# Patient Record
Sex: Female | Born: 1960 | Race: White | Hispanic: No | Marital: Married | Smoking: Never smoker
Health system: Southern US, Community
[De-identification: ages and names within clinical notes are randomized; demographics above are authoritative.]

## PROBLEM LIST (undated history)

## (undated) DIAGNOSIS — C6992 Malignant neoplasm of unspecified site of left eye: Secondary | ICD-10-CM

## (undated) HISTORY — PX: EYE SURGERY: SHX253

## (undated) HISTORY — DX: Malignant neoplasm of unspecified site of left eye: C69.92

---

## 2020-10-24 ENCOUNTER — Ambulatory Visit (INDEPENDENT_AMBULATORY_CARE_PROVIDER_SITE_OTHER): Payer: Medicare Other | Admitting: Registered Nurse

## 2020-10-24 ENCOUNTER — Other Ambulatory Visit: Payer: Self-pay

## 2020-10-24 ENCOUNTER — Encounter: Payer: Self-pay | Admitting: Registered Nurse

## 2020-10-24 VITALS — BP 121/74 | HR 78 | Temp 98.2°F | Resp 18 | Ht 66.0 in | Wt 128.2 lb

## 2020-10-24 DIAGNOSIS — I10 Essential (primary) hypertension: Secondary | ICD-10-CM

## 2020-10-24 DIAGNOSIS — F4321 Adjustment disorder with depressed mood: Secondary | ICD-10-CM | POA: Diagnosis not present

## 2020-10-24 DIAGNOSIS — G479 Sleep disorder, unspecified: Secondary | ICD-10-CM | POA: Diagnosis not present

## 2020-10-24 DIAGNOSIS — Z1211 Encounter for screening for malignant neoplasm of colon: Secondary | ICD-10-CM

## 2020-10-24 DIAGNOSIS — Z130 Encounter for screening for diseases of the blood and blood-forming organs and certain disorders involving the immune mechanism: Secondary | ICD-10-CM

## 2020-10-24 DIAGNOSIS — Z13228 Encounter for screening for other metabolic disorders: Secondary | ICD-10-CM | POA: Diagnosis not present

## 2020-10-24 DIAGNOSIS — G43709 Chronic migraine without aura, not intractable, without status migrainosus: Secondary | ICD-10-CM | POA: Diagnosis not present

## 2020-10-24 DIAGNOSIS — R7989 Other specified abnormal findings of blood chemistry: Secondary | ICD-10-CM

## 2020-10-24 DIAGNOSIS — Z8639 Personal history of other endocrine, nutritional and metabolic disease: Secondary | ICD-10-CM

## 2020-10-24 DIAGNOSIS — J029 Acute pharyngitis, unspecified: Secondary | ICD-10-CM

## 2020-10-24 DIAGNOSIS — Z13220 Encounter for screening for lipoid disorders: Secondary | ICD-10-CM

## 2020-10-24 DIAGNOSIS — Z1329 Encounter for screening for other suspected endocrine disorder: Secondary | ICD-10-CM

## 2020-10-24 MED ORDER — TEMAZEPAM 30 MG PO CAPS: 30.0000 mg | ORAL_CAPSULE | Freq: Every evening | ORAL | 0 refills | Status: DC | PRN

## 2020-10-24 MED ORDER — BUTALBITAL-APAP-CAFFEINE 50-300-40 MG PO CAPS
1.0000 | ORAL_CAPSULE | Freq: Four times a day (QID) | ORAL | 0 refills | Status: DC | PRN
Start: 2020-10-24 — End: 2021-03-15

## 2020-10-24 MED ORDER — FLUCONAZOLE 150 MG PO TABS: 150.0000 mg | ORAL_TABLET | Freq: Once | ORAL | 0 refills | Status: AC

## 2020-10-24 MED ORDER — INDAPAMIDE 2.5 MG PO TABS: 2.5000 mg | ORAL_TABLET | Freq: Every day | ORAL | 1 refills | Status: DC

## 2020-10-24 MED ORDER — AMOXICILLIN-POT CLAVULANATE 875-125 MG PO TABS: 1.0000 | ORAL_TABLET | Freq: Two times a day (BID) | ORAL | 0 refills | Status: DC
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
## 2020-10-24 NOTE — Patient Instructions (Signed)
° ° ° °  If you have lab work done today you will be contacted with your lab results within the next 2 weeks.  If you have not heard from us then please contact us. The fastest way to get your results is to register for My Chart. ° ° °IF you received an x-ray today, you will receive an invoice from [facility]. Please contact [facility] at [phone] with questions or concerns regarding your invoice.  ° °IF you received labwork today, you will receive an invoice from [facility]. Please contact [facility] at [phone] with questions or concerns regarding your invoice.  ° °Our billing staff will not be able to assist you with questions regarding bills from these companies. ° °You will be contacted with the lab results as soon as they are available. The fastest way to get your results is to activate your My Chart account. Instructions are located on the last page of this paperwork. If you have not heard from us regarding the results in 2 weeks, please contact this office. °  ° ° ° °

## 2020-10-25 ENCOUNTER — Encounter: Payer: Self-pay | Admitting: Neurology

## 2020-10-25 ENCOUNTER — Other Ambulatory Visit: Payer: Self-pay

## 2020-10-25 LAB — CBC WITH DIFFERENTIAL/PLATELET
Basophils Absolute: 0 10*3/uL (ref 0.0–0.1)
Basophils Relative: 0.3 % (ref 0.0–3.0)
Eosinophils Absolute: 0.1 10*3/uL (ref 0.0–0.7)
Eosinophils Relative: 1.4 % (ref 0.0–5.0)
HCT: 38.9 % (ref 36.0–46.0)
Hemoglobin: 12.9 g/dL (ref 12.0–15.0)
Lymphocytes Relative: 32 % (ref 12.0–46.0)
Lymphs Abs: 1.7 10*3/uL (ref 0.7–4.0)
MCHC: 33.2 g/dL (ref 30.0–36.0)
MCV: 88.8 fl (ref 78.0–100.0)
Monocytes Absolute: 0.4 10*3/uL (ref 0.1–1.0)
Monocytes Relative: 8.2 % (ref 3.0–12.0)
Neutro Abs: 3.1 10*3/uL (ref 1.4–7.7)
Neutrophils Relative %: 58.1 % (ref 43.0–77.0)
Platelets: 176 10*3/uL (ref 150.0–400.0)
RBC: 4.38 Mil/uL (ref 3.87–5.11)
RDW: 13.4 % (ref 11.5–15.5)
WBC: 5.4 10*3/uL (ref 4.0–10.5)

## 2020-10-25 LAB — LIPID PANEL
Cholesterol: 188 mg/dL (ref 0–200)
HDL: 57.4 mg/dL (ref >39.00)
LDL Cholesterol: 99 mg/dL (ref 0–99)
NonHDL: 130.78
Total CHOL/HDL Ratio: 3
Triglycerides: 157 mg/dL — ABNORMAL HIGH (ref 0.0–149.0)
VLDL: 31.4 mg/dL (ref 0.0–40.0)

## 2020-10-25 LAB — COMPREHENSIVE METABOLIC PANEL
ALT: 17 U/L (ref 0–35)
AST: 29 U/L (ref 0–37)
Albumin: 4.2 g/dL (ref 3.5–5.2)
Alkaline Phosphatase: 78 U/L (ref 39–117)
BUN: 12 mg/dL (ref 6–23)
CO2: 28 mEq/L (ref 19–32)
Calcium: 9.2 mg/dL (ref 8.4–10.5)
Chloride: 98 mEq/L (ref 96–112)
Creatinine, Ser: 0.73 mg/dL (ref 0.40–1.20)
GFR: 89.28 mL/min (ref >60.00)
Glucose, Bld: 79 mg/dL (ref 70–99)
Potassium: 3.4 mEq/L — ABNORMAL LOW (ref 3.5–5.1)
Sodium: 134 mEq/L — ABNORMAL LOW (ref 135–145)
Total Bilirubin: 0.4 mg/dL (ref 0.2–1.2)
Total Protein: 6.6 g/dL (ref 6.0–8.3)

## 2020-10-25 LAB — HEMOGLOBIN A1C: Hgb A1c MFr Bld: 5.4 % (ref 4.6–6.5)

## 2020-10-25 LAB — TSH: TSH: 1.76 u[IU]/mL (ref 0.35–5.50)

## 2020-10-26 ENCOUNTER — Encounter: Payer: Self-pay | Admitting: Registered Nurse

## 2020-11-01 ENCOUNTER — Telehealth (INDEPENDENT_AMBULATORY_CARE_PROVIDER_SITE_OTHER): Payer: Medicare Other | Admitting: Registered Nurse

## 2020-11-01 ENCOUNTER — Other Ambulatory Visit: Payer: Self-pay

## 2020-11-01 DIAGNOSIS — J302 Other seasonal allergic rhinitis: Secondary | ICD-10-CM

## 2020-11-01 MED ORDER — CETIRIZINE HCL 10 MG PO TABS: 10.0000 mg | ORAL_TABLET | Freq: Every day | ORAL | 11 refills | Status: AC

## 2020-11-01 MED ORDER — AZELASTINE HCL 0.1 % NA SOLN
1.0000 | Freq: Two times a day (BID) | NASAL | 12 refills | Status: DC
Start: 2020-11-01 — End: 2021-04-17

## 2020-11-01 MED ORDER — PREDNISONE 10 MG (21) PO TBPK
ORAL_TABLET | ORAL | 0 refills | Status: DC
Start: 2020-11-01 — End: 2021-04-17

## 2020-11-01 NOTE — Progress Notes (Signed)
Telemedicine Encounter- SOAP NOTE Established Patient  This video encounter was conducted with the patient's (or proxy's) verbal consent via audio telecommunications: yes/no: Yes Patient was instructed to have this encounter in a suitably private space; and to only have persons present to whom they give permission to participate. In addition, patient identity was confirmed by use of name plus two identifiers (DOB and address).  I discussed the limitations, risks, security and privacy concerns of performing an evaluation and management service by telephone and the availability of in person appointments. I also discussed with the patient that there may be a patient responsible charge related to this service. The patient expressed understanding and agreed to proceed.  I spent a total of 16 minutes talking with the patient or their proxy.  Patient at home Provider in office  Participants: Kathrin Ruddy, NP and Margrett Rud  No chief complaint on file.   Subjective   Sally Ware is a 60 y.o. established patient. Video visit today for sore throat  HPI Ongoing. Has been on abx for a week Persistent headaches   Hx of seasonal allergies - noted first when she had moved to [place]. Originally from [place].  A lot of mucus production  Has been using mucinex.  Has not been using daily antihistamines    There are no problems to display for this patient.   Past Medical History:  Diagnosis Date   Eye cancer, left [facility])     Current Outpatient Medications  Medication Sig Dispense Refill   amoxicillin-clavulanate (AUGMENTIN) 875-125 MG tablet Take 1 tablet by mouth 2 (two) times daily. 20 tablet 0   buPROPion (WELLBUTRIN XL) 150 MG 24 hr tablet Take 150 mg by mouth daily.     Butalbital-APAP-Caffeine 50-300-40 MG CAPS Take 1 tablet by mouth every 6 (six) hours as needed. 84 capsule 0   indapamide (LOZOL) 2.5 MG tablet Take 1 tablet (2.5 mg total) by mouth daily. 90 tablet 1   indapamide  (LOZOL) 2.5 MG tablet Take by mouth.     LORazepam (ATIVAN) 1 MG tablet Take by mouth.     meclizine (ANTIVERT) 25 MG tablet Take 25 mg by mouth 3 (three) times daily as needed for dizziness.     temazepam (RESTORIL) 30 MG capsule Take 1 capsule (30 mg total) by mouth at bedtime as needed for sleep. 30 capsule 0   topiramate (TOPAMAX) 50 MG tablet Take by mouth.     No current facility-administered medications for this visit.    No Known Allergies  Social History   Socioeconomic History   Marital status: Married    Spouse name: Not on file   Number of children: Not on file   Years of education: Not on file   Highest education level: Not on file  Occupational History   Not on file  Tobacco Use   Smoking status: Never   Smokeless tobacco: Never  Vaping Use   Vaping Use: Never used  Substance and Sexual Activity   Alcohol use: Not Currently   Drug use: Not Currently   Sexual activity: Not Currently  Other Topics Concern   Not on file  Social History Narrative   Not on file   Social Determinants of Health   Financial Resource Strain: Not on file  Food Insecurity: Not on file  Transportation Needs: Not on file  Physical Activity: Not on file  Stress: Not on file  Social Connections: Not on file  Intimate Partner Violence: Not on file  ROS Per hpi   Objective   Vitals as reported by the patient: There were no vitals filed for this visit.  There are no diagnoses linked to this encounter.  PLAN Suspect alleriges/inflammation. Treat as above. Return if worsening or failing to improve Can consider consult with ENT or allergy doc if persistent Patient encouraged to call clinic with any questions, comments, or concerns.  I discussed the assessment and treatment plan with the patient. The patient was provided an opportunity to ask questions and all were answered. The patient agreed with the plan and demonstrated an understanding of the instructions.   The patient  was advised to call back or seek an in-person evaluation if the symptoms worsen or if the condition fails to improve as anticipated.  I provided 14 minutes of face-to-face time during this encounter.  Maximiano Coss, NP

## 2020-11-03 LAB — COLOGUARD: Cologuard: NEGATIVE

## 2020-11-19 ENCOUNTER — Other Ambulatory Visit: Payer: Self-pay | Admitting: Registered Nurse

## 2020-11-19 DIAGNOSIS — Z1231 Encounter for screening mammogram for malignant neoplasm of breast: Secondary | ICD-10-CM

## 2020-12-03 ENCOUNTER — Encounter: Payer: Self-pay | Admitting: Registered Nurse

## 2020-12-04 ENCOUNTER — Other Ambulatory Visit: Payer: Self-pay

## 2020-12-04 DIAGNOSIS — G43709 Chronic migraine without aura, not intractable, without status migrainosus: Secondary | ICD-10-CM

## 2020-12-04 MED ORDER — TOPIRAMATE 50 MG PO TABS: 50.0000 mg | ORAL_TABLET | Freq: Every day | ORAL | 3 refills | Status: DC

## 2020-12-05 ENCOUNTER — Encounter: Payer: Self-pay | Admitting: Registered Nurse

## 2020-12-06 ENCOUNTER — Other Ambulatory Visit: Payer: Self-pay | Admitting: Registered Nurse

## 2020-12-06 DIAGNOSIS — G43709 Chronic migraine without aura, not intractable, without status migrainosus: Secondary | ICD-10-CM

## 2020-12-06 DIAGNOSIS — Z1231 Encounter for screening mammogram for malignant neoplasm of breast: Secondary | ICD-10-CM

## 2020-12-06 MED ORDER — TOPIRAMATE 50 MG PO TABS: 50.0000 mg | ORAL_TABLET | Freq: Two times a day (BID) | ORAL | 3 refills | Status: DC

## 2020-12-16 ENCOUNTER — Other Ambulatory Visit: Payer: Self-pay | Admitting: Registered Nurse

## 2020-12-16 ENCOUNTER — Encounter: Payer: Self-pay | Admitting: Registered Nurse

## 2020-12-18 ENCOUNTER — Encounter: Payer: Self-pay | Admitting: Registered Nurse

## 2020-12-18 ENCOUNTER — Other Ambulatory Visit: Payer: Self-pay

## 2020-12-18 MED ORDER — LORAZEPAM 1 MG PO TABS
0.5000 mg | ORAL_TABLET | Freq: Two times a day (BID) | ORAL | 0 refills | Status: DC | PRN
  Filled 2020-12-18: qty 90, 45d supply, fill #0

## 2020-12-20 ENCOUNTER — Other Ambulatory Visit: Payer: Self-pay | Admitting: Registered Nurse

## 2020-12-20 DIAGNOSIS — F4321 Adjustment disorder with depressed mood: Secondary | ICD-10-CM

## 2020-12-20 DIAGNOSIS — G479 Sleep disorder, unspecified: Secondary | ICD-10-CM

## 2020-12-20 MED ORDER — LORAZEPAM 1 MG PO TABS
1.0000 mg | ORAL_TABLET | Freq: Two times a day (BID) | ORAL | 1 refills | Status: DC | PRN
Start: 2020-12-20 — End: 2021-04-17

## 2020-12-20 NOTE — Telephone Encounter (Signed)
Resent  Thanks  [employer]

## 2021-01-17 ENCOUNTER — Ambulatory Visit
Admission: RE | Admit: 2021-01-17 | Discharge: 2021-01-17 | Disposition: A | Discharge status: Home / Self Care | Payer: Medicare Other | Source: Ambulatory Visit | Attending: Registered Nurse | Admitting: Registered Nurse

## 2021-01-17 DIAGNOSIS — Z1231 Encounter for screening mammogram for malignant neoplasm of breast: Secondary | ICD-10-CM

## 2021-01-24 ENCOUNTER — Encounter: Payer: Self-pay | Admitting: Registered Nurse

## 2021-01-30 NOTE — Progress Notes (Signed)
NEUROLOGY CONSULTATION NOTE  Sally Ware MRN: [id number] DOB: 01-20-1961  Referring provider: Maximiano Coss, NP Primary care provider:  Maximiano Coss, NP  Reason for consult:  migraines  Assessment/Plan:   Chronic migraine without aura, without status migrainosus, intractable - for years, she has had over 15 headache days a month.  She has tried and failed multiple migraine preventatives including topiramate, Depakote, venlafaxine, amitriptyline, and propranolol.  Migraine prevention:  I would like to start her on a CGRP inhibitor.  However, given that she is on Medicare, the cost may be high.  I have asked that she contact her insurance to find out which CGRP inhibitors are formulary and what would be the cost for her out of pocket.  If this is not a reasonable option, then I would recommend Botox Migraine rescue:  Will have her try samples of Ubrelvy 100mg  Limit use of pain relievers to no more than 2 days out of week to prevent risk of rebound or medication-overuse headache. Keep headache diary Follow up 6-7 months    Subjective:  Sally Ware is a 61 year old female who presents for migraines.  History supplemented by referring provider's note.  Onset:  61 years old Location:  top of head Quality:  pressure Intensity:  8-19/10.  She denies new headache, thunderclap headache Aura:  absent Prodrome:  absent Associated symptoms:  Nausea, photophobia, phonophobia.  She denies associated vomiting, visual aura, unilateral numbness or weakness. Duration:  Sometimes wakes up with headaches.  2 days Frequency:  4 to 5 days a week Frequency of abortive medication: Excedrin or Fioricet Triggers:  lunch meat, chocolate, alcohol Relieving factors:  headache hat, rest in dark and quiet room Activity:  aggravates.  Current NSAIDS/analgesics:  Excedrin, Fioricet Current triptans:  none Current ergotamine:  none Current anti-emetic:  none Current muscle relaxants:  none Current  Antihypertensive medications:  indapamide 2.5mg   Current Antidepressant medications:  bupropion ER 150mg  daily Current Anticonvulsant medications:  topiramate 50mg  BID Current anti-CGRP:  none Current Vitamins/Herbal/Supplements:  none Current Antihistamines/Decongestants:  Zyrtec Other therapy:  none Hormone/birth control:  none Other medications:  temazepam 30mg  QHS PRN (insomnia)  Past NSAIDS/analgesics:  Midrin Past abortive triptans:  sumatriptan tab/[place], rizatriptan, eletriptan, zolmitriptan, Axert Past abortive ergotamine:  none Past muscle relaxants:  none Past anti-emetic:  zofran, promethazine Past antihypertensive medications:  propranolol, verapamil Past antidepressant medications:  amitriptyline, venlafaxine Past anticonvulsant medications:  Depakote, gabapentin Past anti-CGRP:  none Past vitamins/Herbal/Supplements:  magnesium, riboflavin, CoQ10, feverfew, butterbur Past antihistamines/decongestants:  none Other past therapies:  accu-pressure, chiropractic  Caffeine:  No Alcohol:  no Smoker:  no Diet:  hydrates.  Eats healthy.  No soda.  Tries not to skip meals Exercise:  yes Depression/Anxiety:  Daughter was murdered by her husband several years ago.  She and her husband has since been raising her grandson who witnessed the event.  He has had mental health issues over the years which have been a great source of emotional stress.   Other pain:  no Sleep hygiene:  poor Family history of headache:  mother, daughters      PAST MEDICAL HISTORY: Past Medical History:  Diagnosis Date   Eye cancer, left ([place] [place])     PAST SURGICAL HISTORY: Past Surgical History:  Procedure Laterality Date   CESAREAN SECTION     EYE SURGERY      MEDICATIONS: Current Outpatient Medications on File Prior to Visit  Medication Sig Dispense Refill   azelastine (ASTELIN) 0.1 % nasal spray Place  1 spray into both nostrils 2 (two) times daily. Use in each nostril as directed 30 mL 12    buPROPion (WELLBUTRIN XL) 150 MG 24 hr tablet Take 150 mg by mouth daily.     Butalbital-APAP-Caffeine 50-300-40 MG CAPS Take 1 tablet by mouth every 6 (six) hours as needed. 84 capsule 0   cetirizine (ZYRTEC) 10 MG tablet Take 1 tablet (10 mg total) by mouth daily. 30 tablet 11   indapamide (LOZOL) 2.5 MG tablet Take 1 tablet (2.5 mg total) by mouth daily. 90 tablet 1   indapamide (LOZOL) 2.5 MG tablet Take by mouth.     LORazepam (ATIVAN) 1 MG tablet Take 1 tablet (1 mg total) by mouth 2 (two) times daily as needed for anxiety. 90 tablet 1   meclizine (ANTIVERT) 25 MG tablet Take 25 mg by mouth 3 (three) times daily as needed for dizziness.     predniSONE (STERAPRED UNI-PAK 21 TAB) 10 MG (21) TBPK tablet Take per package instructions. Do not skip doses. Finish entire supply. 1 each 0   temazepam (RESTORIL) 30 MG capsule Take 1 capsule (30 mg total) by mouth at bedtime as needed for sleep. 30 capsule 0   topiramate (TOPAMAX) 50 MG tablet Take 1 tablet (50 mg total) by mouth 2 (two) times daily. 180 tablet 3   No current facility-administered medications on file prior to visit.    ALLERGIES: No Known Allergies  FAMILY HISTORY: Family History  Problem Relation Age of Onset   Cancer Mother    Anxiety disorder Sister    OCD Sister    Healthy Daughter    Healthy Son     Objective:  Blood pressure 111/72, pulse 74, resp. rate 20, height 5\' 6"  (1.676 m), weight 132 lb (59.9 kg), SpO2 98 %. General: No acute distress.  Patient appears well-groomed.   Head:  Normocephalic/atraumatic Eyes:  fundi examined but not visualized Neck: supple, no paraspinal tenderness, full range of motion Back: No paraspinal tenderness Heart: regular rate and rhythm Lungs: Clear to auscultation bilaterally. Vascular: No carotid bruits. Neurological Exam: Mental status: alert and oriented to person, place, and time, recent and remote memory intact, fund of knowledge intact, attention and concentration intact,  speech fluent and not dysarthric, language intact. Cranial nerves: CN I: not tested CN II: pupils equal, round and reactive to light, visual fields intact CN III, IV, VI:  full range of motion, no nystagmus, no ptosis CN V: facial sensation intact. CN VII: upper and lower face symmetric CN VIII: hearing intact CN IX, X: gag intact, uvula midline CN XI: sternocleidomastoid and trapezius muscles intact CN XII: tongue midline Bulk & Tone: normal, no fasciculations. Motor:  muscle strength 5/5 throughout Sensation:  Pinprick, temperature and vibratory sensation intact. Deep Tendon Reflexes:  2+ throughout,  toes downgoing.   Finger to nose testing:  Without dysmetria.   Heel to shin:  Without dysmetria.   Gait:  Normal station and stride.  Romberg negative.    Thank you for allowing me to take part in the care of this patient.  Metta Clines, DO  CC: Maximiano Coss, NP

## 2021-01-31 ENCOUNTER — Other Ambulatory Visit: Payer: Self-pay

## 2021-01-31 ENCOUNTER — Encounter: Payer: Self-pay | Admitting: Neurology

## 2021-01-31 ENCOUNTER — Ambulatory Visit (INDEPENDENT_AMBULATORY_CARE_PROVIDER_SITE_OTHER): Payer: Medicare Other | Admitting: Neurology

## 2021-01-31 VITALS — BP 111/72 | HR 74 | Resp 20 | Ht 66.0 in | Wt 132.0 lb

## 2021-01-31 DIAGNOSIS — G43719 Chronic migraine without aura, intractable, without status migrainosus: Secondary | ICD-10-CM

## 2021-01-31 MED ORDER — ONDANSETRON HCL 4 MG PO TABS: 4.0000 mg | ORAL_TABLET | Freq: Three times a day (TID) | ORAL | 5 refills | Status: DC | PRN

## 2021-01-31 NOTE — Progress Notes (Signed)
Medication Samples have been provided to the patient.  Drug name: Roselyn Meier        Strength: 100 mg        Qty:  4 LOT: [id number]  Exp.Date: 7/23  Dosing instructions: as needed  The patient has been instructed regarding the correct time, dose, and frequency of taking this medication, including desired effects and most common side effects.   Venetia Night 3:26 PM 01/31/2021

## 2021-01-31 NOTE — Patient Instructions (Addendum)
°  Contact your insurance to find out which of following medications are formulary and what they would cost you - let me know:  Aimovig, Ajovy, Emgality, Ubrelvy, Nurtec Taper off of topiramate - take 50mg  at bedtime for one week, then stop Take Ubrelvy 100mg  at earliest onset of headache.  May repeat dose once in 2 hours if needed.  Maximum 2 tablets in 24 hours. Take ondansetron for nausea STOP EXCEDRIN AND FIORICET  Limit use of pain relievers to no more than 2 days out of the week.  These medications include acetaminophen, NSAIDs (ibuprofen/Advil/Motrin, naproxen/Aleve, triptans (Imitrex/sumatriptan), Excedrin, and narcotics.  This will help reduce risk of rebound headaches. Be aware of common food triggers:  - Caffeine:  coffee, black tea, cola, Mt. Dew  - Chocolate  - Dairy:  aged cheeses (brie, blue, cheddar, gouda, [place], provolone, [place], Swiss, etc), chocolate milk, buttermilk, sour cream, limit eggs and yogurt  - Nuts, peanut butter  - Alcohol  - Cereals/grains:  FRESH breads (fresh bagels, sourdough, doughnuts), yeast productions  - Processed/canned/aged/cured meats (pre-packaged deli meats, hotdogs)  - MSG/glutamate:  soy sauce, flavor enhancer, pickled/preserved/marinated foods  - Sweeteners:  aspartame (Equal, Nutrasweet).  Sugar and Splenda are okay  - Vegetables:  legumes (lima beans, lentils, snow peas, fava beans, pinto peans, peas, garbanzo beans), sauerkraut, onions, olives, pickles  - Fruit:  avocados, bananas, citrus fruit (orange, lemon, grapefruit), mango  - Other:  Frozen meals, macaroni and cheese Routine exercise Stay adequately hydrated (aim for 64 oz water daily) Keep headache diary Maintain proper stress management Maintain proper sleep hygiene Do not skip meals Consider supplements:  magnesium citrate 400mg  daily, riboflavin 400mg  daily, coenzyme Q10 100mg  three times daily.

## 2021-02-01 ENCOUNTER — Encounter: Payer: Self-pay | Admitting: Neurology

## 2021-02-04 ENCOUNTER — Other Ambulatory Visit: Payer: Self-pay | Admitting: Registered Nurse

## 2021-02-04 DIAGNOSIS — G479 Sleep disorder, unspecified: Secondary | ICD-10-CM

## 2021-02-05 MED ORDER — TEMAZEPAM 30 MG PO CAPS: 30.0000 mg | ORAL_CAPSULE | Freq: Every evening | ORAL | 0 refills | Status: DC | PRN

## 2021-02-05 NOTE — Telephone Encounter (Signed)
Patient is requesting a refill of the following medications: Requested Prescriptions   Pending Prescriptions Disp Refills   temazepam (RESTORIL) 30 MG capsule 30 capsule 0    Sig: Take 1 capsule (30 mg total) by mouth at bedtime as needed for sleep.    Date of patient request: 02/05/21 Last office visit: 01/30/21 Date of last refill: 10/24/20 Last refill amount: 30

## 2021-02-06 ENCOUNTER — Telehealth: Payer: Self-pay | Admitting: Neurology

## 2021-02-06 NOTE — Telephone Encounter (Signed)
Pt advised Botox will be checked and give her a call to schedule.

## 2021-02-06 NOTE — Telephone Encounter (Signed)
Pt called in stating the list of medications she was given will not be covered. They stated they would cover the botox if it was medically necessary and certified by our office.

## 2021-02-21 ENCOUNTER — Telehealth: Payer: Self-pay

## 2021-02-21 NOTE — Telephone Encounter (Signed)
New message   Fax [phone] & completed [employer] form for Botox medication   Attached clinical notes

## 2021-02-21 NOTE — Telephone Encounter (Signed)
How frequent or the headaches (on average, how many days a week/month are they occurring)?  Over a day, 20 Migraines since her last visit 01/31/21. Per Pt she wakes up with them  How long do the headaches last?  Over a day Verify what preventative medication and dose you are taking (e.g. topiramate, propranolol, amitriptyline, Emgality, etc)  NONE right now, Waiting on Botox approval.  Verify which rescue medication you are taking (triptan, Advil, Excedrin, Aleve, Ubrelvy, etc)  Urbelvy, not effective How often are you taking pain relievers/analgesics/rescue mediction?  Not at all.     Per pt since Monday 23rd she had two.

## 2021-03-01 NOTE — Telephone Encounter (Signed)
Pt called in and left a message wanting to speak with someone about getting her Botox set up.

## 2021-03-07 NOTE — Telephone Encounter (Addendum)
Fax on 1.25.2023   11:07 AM Note   New message    Fax [phone] & completed [employer] form for Botox medication    Attached clinical notes

## 2021-03-08 ENCOUNTER — Telehealth: Payer: Self-pay
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
## 2021-03-09 NOTE — Telephone Encounter (Signed)
Advised pt of Dr.Jaffe note,  If she has a driver, she may come in for a headache cocktail.  Otherwise, a Toradol shot.  What is status for Botox?  Did she try Ubrelvy?  We can prescribe sumatriptan 20mg  NS - 1 spray in one nostril, may repeat once after 2 hours (maximum 2 sprays in 24 hours).  Quantity 6, Refill 5.  Pt come by to pick up Urbelvy samples.  Pt wanted to know if she can get a script sent into the pharmacy.

## 2021-03-09 NOTE — Telephone Encounter (Signed)
Pt pick up samples of Urblevy  Medication Samples have been provided to the patient.  Drug name: Roselyn Meier       Strength: 100 mg        Qty: 4  LOT: [id number]  Exp.Date: 07/2021  Dosing instructions: as  needed  The patient has been instructed regarding the correct time, dose, and frequency of taking this medication, including desired effects and most common side effects.   Venetia Night 1:04 PM 03/09/2021   Advised pt to see what her insurance will cover if Roselyn Meier not the one they do cover. Pt states the it works but she will call to see what else they will allow.

## 2021-03-15 ENCOUNTER — Other Ambulatory Visit: Payer: Self-pay | Admitting: Registered Nurse

## 2021-03-15 DIAGNOSIS — G43709 Chronic migraine without aura, not intractable, without status migrainosus: Secondary | ICD-10-CM

## 2021-03-15 NOTE — Telephone Encounter (Signed)
Per Pt she was advised by her insurance they will cover Emrge and Zomig for abortive medications.   Pt also wanted  a update on Her Botox PA. Advised pt as of 03/07/21 new notes faxed over to her insurance waiting on response.

## 2021-03-16 ENCOUNTER — Telehealth: Payer: Self-pay

## 2021-03-16 ENCOUNTER — Other Ambulatory Visit: Payer: Self-pay | Admitting: Neurology

## 2021-03-16 MED ORDER — BUTALBITAL-APAP-CAFFEINE 50-300-40 MG PO CAPS: 1.0000 | ORAL_CAPSULE | Freq: Four times a day (QID) | ORAL | 0 refills | Status: DC | PRN

## 2021-03-16 MED ORDER — NARATRIPTAN HCL 2.5 MG PO TABS
2.5000 mg | ORAL_TABLET | ORAL | 5 refills | Status: DC | PRN
Start: 2021-03-16 — End: 2021-04-17

## 2021-03-16 NOTE — Progress Notes (Unsigned)
am

## 2021-03-16 NOTE — Telephone Encounter (Signed)
F/u     Refax form back to CMS  [facility] with clinical notes  to [phone]  Medication Botox

## 2021-03-16 NOTE — Telephone Encounter (Signed)
New message   Refax form back to CMS  [facility] with clinical notes  to [phone]

## 2021-03-16 NOTE — Telephone Encounter (Signed)
Patient is requesting a refill of the following medications: Requested Prescriptions   Pending Prescriptions Disp Refills   Butalbital-APAP-Caffeine 50-300-40 MG CAPS 84 capsule 0    Sig: Take 1 tablet by mouth every 6 (six) hours as needed.    Date of patient request: 03/15/2021 Last office visit: 10/24/2020 Date of last refill: 10/24/2020 Last refill amount: 80 capsules Follow up time period per chart: 04/24/2021

## 2021-03-19 NOTE — Telephone Encounter (Signed)
F/u  Received from [facility] for facility and valid fax number for the facility.  All information is listed on the form and have been refax back to [facility].   Group NPI  Tax ID  Group [id number]   Along with Dr. Tomi Likens NPI / DEA/ Eustaquio Maize with clinical notes   Previously sent fax on  1/27 , 2/17 & 2/20

## 2021-04-02 ENCOUNTER — Other Ambulatory Visit: Payer: Self-pay

## 2021-04-02 ENCOUNTER — Encounter: Payer: Self-pay | Admitting: Podiatry

## 2021-04-02 ENCOUNTER — Ambulatory Visit (INDEPENDENT_AMBULATORY_CARE_PROVIDER_SITE_OTHER): Payer: Medicare Other | Admitting: Podiatry

## 2021-04-02 ENCOUNTER — Ambulatory Visit (INDEPENDENT_AMBULATORY_CARE_PROVIDER_SITE_OTHER): Payer: Medicare Other

## 2021-04-02 DIAGNOSIS — M21612 Bunion of left foot: Secondary | ICD-10-CM

## 2021-04-02 DIAGNOSIS — M21619 Bunion of unspecified foot: Secondary | ICD-10-CM

## 2021-04-02 DIAGNOSIS — M21611 Bunion of right foot: Secondary | ICD-10-CM

## 2021-04-02 DIAGNOSIS — M7672 Peroneal tendinitis, left leg: Secondary | ICD-10-CM

## 2021-04-02 NOTE — Patient Instructions (Signed)

## 2021-04-03 NOTE — Progress Notes (Signed)
Subjective:  ? ?Patient ID: Sally Ware, female   DOB: 61 y.o.   MRN: [id number]  ? ?HPI ?Patient presents stating she has had a lot of pain in her bunion right over left for years and has not wanted to take care of it and now finally is able to.  Also complains of pain in the outside of her left foot states its been inflamed and has been bothering her for a number of months.  Patient does not smoke likes to be active ? ? ?Review of Systems  ?All other systems reviewed and are negative. ? ? ?   ?Objective:  ?Physical Exam ?Vitals and nursing note reviewed.  ?Constitutional:   ?   Appearance: She is well-developed.  ?Pulmonary:  ?   Effort: Pulmonary effort is normal.  ?Musculoskeletal:     ?   General: Normal range of motion.  ?Skin: ?   General: Skin is warm.  ?Neurological:  ?   Mental Status: She is alert.  ?  ?Neurovascular status intact muscle strength adequate range of motion within normal limits with patient found to have redness and pain around the first metatarsal head right over left deviation of the hallux no range of motion loss and inflammation pain around the fifth metatarsal base left with fluid buildup at insertion and no indication of muscle strength loss ? ?   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 ?   ?Plan:  ?H&P x-rays reviewed conditions discussed at great length.  We reviewed conservative surgical options and she has tried wider shoes she is tried soaks and other modalities and would prefer correction.  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  She wants surgery in the right foot will be done first and I allowed her to read consent form going over alternative treatments complications and everything is outlined in the consent form.  Patient is  amendable to this signed consent form every question was answered and air fracture walker dispensed that I want her to get used to prior to surgery.  I will also go ahead and inject the left peroneal base at this time and I explained to her the procedure and risk and that is also signed on the consent form.  Patient scheduled outpatient surgery in the next several weeks and all questions encouraged and she can call ? ?X-rays indicate elevation of the intermetatarsal angle of a mild nature but large elevation and large bone spur formation around the first metatarsal head right and left ?   ? ? ?

## 2021-04-09 ENCOUNTER — Telehealth: Payer: Self-pay | Admitting: Neurology

## 2021-04-09 NOTE — Telephone Encounter (Signed)
Advised pt we will check the status of the PA an let her know the next steps.  ?

## 2021-04-09 NOTE — Telephone Encounter (Signed)
Patient called and left a voice mail requesting a call back with a status update on her Botox PA. ?

## 2021-04-17 ENCOUNTER — Encounter: Payer: Self-pay | Admitting: Registered Nurse

## 2021-04-17 ENCOUNTER — Ambulatory Visit (INDEPENDENT_AMBULATORY_CARE_PROVIDER_SITE_OTHER): Payer: Medicare Other | Admitting: Registered Nurse

## 2021-04-17 ENCOUNTER — Telehealth: Payer: Self-pay | Admitting: Neurology

## 2021-04-17 ENCOUNTER — Other Ambulatory Visit: Payer: Self-pay

## 2021-04-17 VITALS — BP 113/79 | HR 74 | Temp 98.0°F | Resp 18 | Ht 66.0 in | Wt 132.0 lb

## 2021-04-17 DIAGNOSIS — K0510 Chronic gingivitis, plaque induced: Secondary | ICD-10-CM

## 2021-04-17 DIAGNOSIS — F4321 Adjustment disorder with depressed mood: Secondary | ICD-10-CM

## 2021-04-17 DIAGNOSIS — I10 Essential (primary) hypertension: Secondary | ICD-10-CM | POA: Diagnosis not present

## 2021-04-17 DIAGNOSIS — G43709 Chronic migraine without aura, not intractable, without status migrainosus: Secondary | ICD-10-CM

## 2021-04-17 DIAGNOSIS — G479 Sleep disorder, unspecified: Secondary | ICD-10-CM

## 2021-04-17 MED ORDER — QUETIAPINE FUMARATE 100 MG PO TABS: 50.0000 mg | ORAL_TABLET | Freq: Every day | ORAL | 0 refills | Status: DC

## 2021-04-17 MED ORDER — LORAZEPAM 1 MG PO TABS: 1.0000 mg | ORAL_TABLET | Freq: Two times a day (BID) | ORAL | 1 refills | Status: AC | PRN

## 2021-04-17 MED ORDER — INDAPAMIDE 2.5 MG PO TABS: 2.5000 mg | ORAL_TABLET | Freq: Every day | ORAL | 1 refills | Status: AC

## 2021-04-17 MED ORDER — BUTALBITAL-APAP-CAFFEINE 50-300-40 MG PO CAPS: 1.0000 | ORAL_CAPSULE | Freq: Four times a day (QID) | ORAL | 0 refills | Status: DC | PRN

## 2021-04-17 MED ORDER — BUPROPION HCL ER (XL) 150 MG PO TB24: 150.0000 mg | ORAL_TABLET | Freq: Every day | ORAL | 1 refills | Status: DC

## 2021-04-17 MED ORDER — ONDANSETRON HCL 4 MG PO TABS: 4.0000 mg | ORAL_TABLET | Freq: Three times a day (TID) | ORAL | 5 refills | Status: DC | PRN

## 2021-04-17 NOTE — Telephone Encounter (Signed)
Patient called and stated that she still has not heard anything about her prior authorization for botox.  She was following up. ?

## 2021-04-17 NOTE — Progress Notes (Signed)
? ?Established Patient Office Visit ? ?Subjective:  ?Patient ID: Sally Ware, female    DOB: [birth year]  Age: 61 y.o. MRN: [id number] ? ?CC:  ?Chief Complaint  ?Patient presents with  ? Follow-up  ?  Patient states she is here for follow up on medications and medication refill.   ? ? ?HPI ?Sally Ware presents for follow up ? ?Anxiety -  ?History of trauma ?Takes wellbutrin '150mg'$  po qd, lorazepam '1mg'$  po qd prn, temazepam '30mg'$  po qhs prn. ?Good effect, no AE. Hopes to continue. ? ?Notes temazepam not as effective for sleep ?Has not tried other agents ?Sleeps maybe 5 hours per night at most.  ?Interested in options ?Endorses good sleep hygiene.  ? ?Past Medical History:  ?Diagnosis Date  ? Eye cancer, left [facility])   ? ? ?Past Surgical History:  ?Procedure Laterality Date  ? CESAREAN SECTION    ? EYE SURGERY    ? ? ?Family History  ?Problem Relation Age of Onset  ? Cancer Mother   ? Anxiety disorder Sister   ? OCD Sister   ? Healthy Daughter   ? Healthy Son   ? ? ?Social History  ? ?Socioeconomic History  ? Marital status: Married  ?  Spouse name: Not on file  ? Number of children: Not on file  ? Years of education: Not on file  ? Highest education level: Not on file  ?Occupational History  ? Not on file  ?Tobacco Use  ? Smoking status: Never  ? Smokeless tobacco: Never  ?Vaping Use  ? Vaping Use: Never used  ?Substance and Sexual Activity  ? Alcohol use: Not Currently  ? Drug use: Not Currently  ? Sexual activity: Not Currently  ?Other Topics Concern  ? Not on file  ?Social History Narrative  ? Right handed  ? No caffeine  ? 2 story home  ? ?Social Determinants of Health  ? ?Financial Resource Strain: Not on file  ?Food Insecurity: Not on file  ?Transportation Needs: Not on file  ?Physical Activity: Not on file  ?Stress: Not on file  ?Social Connections: Not on file  ?Intimate Partner Violence: Not on file  ? ? ?Outpatient Medications Prior to Visit  ?Medication Sig Dispense Refill  ? cetirizine (ZYRTEC) 10 MG tablet  Take 1 tablet (10 mg total) by mouth daily. 30 tablet 11  ? meclizine (ANTIVERT) 25 MG tablet Take 25 mg by mouth 3 (three) times daily as needed for dizziness.    ? buPROPion (WELLBUTRIN XL) 150 MG 24 hr tablet Take 150 mg by mouth daily.    ? Butalbital-APAP-Caffeine 50-300-40 MG CAPS Take 1 tablet by mouth every 6 (six) hours as needed. 84 capsule 0  ? indapamide (LOZOL) 2.5 MG tablet Take 1 tablet (2.5 mg total) by mouth daily. 90 tablet 1  ? indapamide (LOZOL) 2.5 MG tablet Take by mouth.    ? LORazepam (ATIVAN) 1 MG tablet Take 1 tablet (1 mg total) by mouth 2 (two) times daily as needed for anxiety. 90 tablet 1  ? ondansetron (ZOFRAN) 4 MG tablet Take 1 tablet (4 mg total) by mouth every 8 (eight) hours as needed for nausea or vomiting. 20 tablet 5  ? temazepam (RESTORIL) 30 MG capsule Take 1 capsule (30 mg total) by mouth at bedtime as needed for sleep. 30 capsule 0  ? azelastine (ASTELIN) 0.1 % nasal spray Place 1 spray into both nostrils 2 (two) times daily. Use in each nostril as directed 30 mL 12  ?  naratriptan (AMERGE) 2.5 MG tablet Take 1 tablet (2.5 mg total) by mouth as needed for migraine. Take one (1) tablet at onset of headache; if returns or does not resolve, may repeat after 4 hours; do not exceed five (5) mg in 24 hours. 10 tablet 5  ? predniSONE (STERAPRED UNI-PAK 21 TAB) 10 MG (21) TBPK tablet Take per package instructions. Do not skip doses. Finish entire supply. (Patient not taking: Reported on 01/31/2021) 1 each 0  ? ?No facility-administered medications prior to visit.  ? ? ?No Known Allergies ? ?ROS ?Review of Systems  ?Constitutional: Negative.   ?HENT: Negative.    ?Eyes: Negative.   ?Respiratory: Negative.    ?Cardiovascular: Negative.   ?Gastrointestinal: Negative.   ?Genitourinary: Negative.   ?Musculoskeletal: Negative.   ?Skin: Negative.   ?Neurological: Negative.   ?Psychiatric/Behavioral: Negative.    ?All other systems reviewed and are negative. ? ?  ?Objective:  ?  ?Physical  Exam ?Vitals and nursing note reviewed.  ?Constitutional:   ?   General: She is not in acute distress. ?   Appearance: Normal appearance. She is normal weight. She is not ill-appearing, toxic-appearing or diaphoretic.  ?Cardiovascular:  ?   Rate and Rhythm: Normal rate and regular rhythm.  ?   Heart sounds: Normal heart sounds. No murmur heard. ?  No friction rub. No gallop.  ?Pulmonary:  ?   Effort: Pulmonary effort is normal. No respiratory distress.  ?   Breath sounds: Normal breath sounds. No stridor. No wheezing, rhonchi or rales.  ?Chest:  ?   Chest wall: No tenderness.  ?Skin: ?   General: Skin is warm and dry.  ?Neurological:  ?   General: No focal deficit present.  ?   Mental Status: She is alert and oriented to person, place, and time. Mental status is at baseline.  ?Psychiatric:     ?   Mood and Affect: Mood normal.     ?   Behavior: Behavior normal.     ?   Thought Content: Thought content normal.     ?   Judgment: Judgment normal.  ? ? ?BP 113/79   Pulse 74   Temp 98 ?F (36.7 ?C) (Temporal)   Resp 18   Ht '5\' 6"'$  (1.676 m)   Wt 132 lb (59.9 kg)   SpO2 100%   BMI 21.31 kg/m?  ?Wt Readings from Last 3 Encounters:  ?04/17/21 132 lb (59.9 kg)  ?01/31/21 132 lb (59.9 kg)  ?10/24/20 128 lb 3.2 oz (58.2 kg)  ? ? ? ?There are no preventive care reminders to display for this patient. ? ?There are no preventive care reminders to display for this patient. ? ?Lab Results  ?Component Value Date  ? TSH 1.76 10/24/2020  ? ?Lab Results  ?Component Value Date  ? WBC 5.4 10/24/2020  ? HGB 12.9 10/24/2020  ? HCT 38.9 10/24/2020  ? MCV 88.8 10/24/2020  ? PLT 176.0 10/24/2020  ? ?Lab Results  ?Component Value Date  ? NA 134 (L) 10/24/2020  ? K 3.4 (L) 10/24/2020  ? CO2 28 10/24/2020  ? GLUCOSE 79 10/24/2020  ? BUN 12 10/24/2020  ? CREATININE 0.73 10/24/2020  ? BILITOT 0.4 10/24/2020  ? ALKPHOS 78 10/24/2020  ? AST 29 10/24/2020  ? ALT 17 10/24/2020  ? PROT 6.6 10/24/2020  ? ALBUMIN 4.2 10/24/2020  ? CALCIUM 9.2  10/24/2020  ? GFR 89.28 10/24/2020  ? ?Lab Results  ?Component Value Date  ? CHOL 188 10/24/2020  ? ?  Lab Results  ?Component Value Date  ? HDL 57.40 10/24/2020  ? ?Lab Results  ?Component Value Date  ? [place] 99 10/24/2020  ? ?Lab Results  ?Component Value Date  ? TRIG 157.0 (H) 10/24/2020  ? ?Lab Results  ?Component Value Date  ? CHOLHDL 3 10/24/2020  ? ?Lab Results  ?Component Value Date  ? HGBA1C 5.4 10/24/2020  ? ? ?  ?Assessment & Plan:  ? ?Problem List Items Addressed This Visit   ?None ?Visit Diagnoses   ? ? Primary hypertension      ? Relevant Medications  ? indapamide (LOZOL) 2.5 MG tablet  ? ondansetron (ZOFRAN) 4 MG tablet  ? Sleep disturbance      ? Relevant Medications  ? buPROPion (WELLBUTRIN XL) 150 MG 24 hr tablet  ? LORazepam (ATIVAN) 1 MG tablet  ? ondansetron (ZOFRAN) 4 MG tablet  ? QUEtiapine (SEROQUEL) 100 MG tablet  ? Complicated grief      ? Relevant Medications  ? buPROPion (WELLBUTRIN XL) 150 MG 24 hr tablet  ? LORazepam (ATIVAN) 1 MG tablet  ? ondansetron (ZOFRAN) 4 MG tablet  ? Chronic migraine without aura without status migrainosus, not intractable      ? Relevant Medications  ? buPROPion (WELLBUTRIN XL) 150 MG 24 hr tablet  ? indapamide (LOZOL) 2.5 MG tablet  ? Butalbital-APAP-Caffeine 50-300-40 MG CAPS  ? ondansetron (ZOFRAN) 4 MG tablet  ? ?  ? ? ?Meds ordered this encounter  ?Medications  ? buPROPion (WELLBUTRIN XL) 150 MG 24 hr tablet  ?  Sig: Take 1 tablet (150 mg total) by mouth daily.  ?  Dispense:  90 tablet  ?  Refill:  1  ?  Order Specific Question:   Supervising Provider  ?  Answer:   Carlota Raspberry, JEFFREY R [[id number]]  ? indapamide (LOZOL) 2.5 MG tablet  ?  Sig: Take 1 tablet (2.5 mg total) by mouth daily.  ?  Dispense:  90 tablet  ?  Refill:  1  ?  Order Specific Question:   Supervising Provider  ?  Answer:   Carlota Raspberry, JEFFREY R [[id number]]  ? LORazepam (ATIVAN) 1 MG tablet  ?  Sig: Take 1 tablet (1 mg total) by mouth 2 (two) times daily as needed for anxiety.  ?  Dispense:  90 tablet  ?   Refill:  1  ?  Order Specific Question:   Supervising Provider  ?  Answer:   Carlota Raspberry, JEFFREY R [[id number]]  ? Butalbital-APAP-Caffeine 50-300-40 MG CAPS  ?  Sig: Take 1 tablet by mouth every 6 (six) hours as needed.  ?  Dis

## 2021-04-17 NOTE — Patient Instructions (Addendum)
Ms. Rund -  ? ?Great to see you ? ?Start seroquel 50-'100mg'$  nightly. Let me know how it goes. Take around 10-1028 if going to bed at 11-1128. ? ?I'll reach out in a few weeks ? ?Call sooner if concerns arise ? ?See you in 6 mo to touch base, repeat labs ? ?Thanks, ? ?Rich  ? ? ? ?If you have lab work done today you will be contacted with your lab results within the next 2 weeks.  If you have not heard from [place] then please contact us. The fastest way to get your results is to register for My Chart. ? ? ?IF you received an x-ray today, you will receive an invoice from [facility]. Please contact [id number] at [phone] with questions or concerns regarding your invoice.  ? ?IF you received labwork today, you will receive an invoice from [facility]. Please contact [facility] at [phone] with questions or concerns regarding your invoice.  ? ?Our billing staff will not be able to assist you with questions regarding bills from these companies. ? ?You will be contacted with the lab results as soon as they are available. The fastest way to get your results is to activate your My Chart account. Instructions are located on the last page of this paperwork. If you have not heard from [place] regarding the results in 2 weeks, please contact this office. ?  ? ? ?

## 2021-04-18 MED ORDER — BUPROPION HCL ER (XL) 150 MG PO TB24: 150.0000 mg | ORAL_TABLET | Freq: Every day | ORAL | 1 refills | Status: AC

## 2021-04-18 MED ORDER — TRIAMCINOLONE ACETONIDE 0.1 % MT PSTE: 1.0000 "application " | PASTE | Freq: Two times a day (BID) | OROMUCOSAL | 12 refills | Status: AC

## 2021-04-18 MED ORDER — TEMAZEPAM 30 MG PO CAPS: 30.0000 mg | ORAL_CAPSULE | Freq: Every evening | ORAL | 0 refills | Status: DC | PRN

## 2021-04-18 NOTE — Addendum Note (Signed)
Addended by: Maximiano Coss on: 04/18/2021 03:41 PM ? ? Modules accepted: Orders ? ?

## 2021-04-23 MED ORDER — OXYCODONE-ACETAMINOPHEN 10-325 MG PO TABS: 1.0000 | ORAL_TABLET | ORAL | 0 refills | Status: DC | PRN

## 2021-04-23 NOTE — Addendum Note (Signed)
Addended by: Wallene Huh on: 04/23/2021 05:00 PM ? ? Modules accepted: Orders ? ?

## 2021-04-23 NOTE — Telephone Encounter (Signed)
Pt advised no response since last update 03/19/21 ?

## 2021-04-23 NOTE — Telephone Encounter (Signed)
Per Margaert at [facility] PA not requried if done in office. Buy and Bill only.  ? ?Sending message to [facility] ?

## 2021-04-24 ENCOUNTER — Encounter: Payer: Self-pay | Admitting: Podiatry

## 2021-04-24 ENCOUNTER — Ambulatory Visit: Payer: Medicare Other | Admitting: Registered Nurse

## 2021-04-24 ENCOUNTER — Telehealth: Payer: Self-pay | Admitting: Podiatry

## 2021-04-24 ENCOUNTER — Telehealth: Payer: Self-pay | Admitting: *Deleted

## 2021-04-24 ENCOUNTER — Other Ambulatory Visit: Payer: Self-pay | Admitting: Podiatry

## 2021-04-24 DIAGNOSIS — M2011 Hallux valgus (acquired), right foot: Secondary | ICD-10-CM | POA: Diagnosis not present

## 2021-04-24 MED ORDER — OXYCODONE-ACETAMINOPHEN 10-325 MG PO TABS: 1.0000 | ORAL_TABLET | Freq: Three times a day (TID) | ORAL | 0 refills | Status: DC | PRN

## 2021-04-24 NOTE — Telephone Encounter (Signed)
Pt is sch for 05-18-21 with Jaffe for the botox injection  ?

## 2021-04-24 NOTE — Telephone Encounter (Signed)
Prescription changed to Percocet 10/325 mg Q8H as recommended by the pharmacist.  Please notify pharmacy.  Thanks, Dr. Amalia Hailey

## 2021-04-24 NOTE — Telephone Encounter (Signed)
Pt called upset she had surgery this morning and is getting very uncomfortable and is needing her medication. The pharmacy is wanting to change something.... Please advise as pt is starting to have pain.... ?

## 2021-04-24 NOTE — Telephone Encounter (Signed)
Pharmacy is calling for clarification on a medication sent(Percocet 10/325 mg), is over 59 Mme which is over initial requirement since patient has never had this drug before and is already taking 2 benzodiazepines daily. They are aware that this patient recently had surgery. She is suggesting lower to 3 tablets for 45 Mme.Please advise. ?

## 2021-04-24 NOTE — Telephone Encounter (Signed)
Prescription changed according to pharmacy recommendations. Please notify patient. - Dr. Amalia Hailey

## 2021-04-25 NOTE — Telephone Encounter (Signed)
Called pt and left message for pt that the medication was sent yesterday late afternoon and to call if any questions. ?

## 2021-04-26 NOTE — Telephone Encounter (Signed)
Patient notified of dosage change.

## 2021-04-30 ENCOUNTER — Ambulatory Visit (INDEPENDENT_AMBULATORY_CARE_PROVIDER_SITE_OTHER): Payer: Medicare Other

## 2021-04-30 ENCOUNTER — Ambulatory Visit (INDEPENDENT_AMBULATORY_CARE_PROVIDER_SITE_OTHER): Payer: Medicare Other | Admitting: Podiatry

## 2021-04-30 ENCOUNTER — Encounter: Payer: Self-pay | Admitting: Podiatry

## 2021-04-30 ENCOUNTER — Encounter: Payer: Self-pay | Admitting: Registered Nurse

## 2021-04-30 DIAGNOSIS — Z9889 Other specified postprocedural states: Secondary | ICD-10-CM | POA: Diagnosis not present

## 2021-04-30 DIAGNOSIS — M2011 Hallux valgus (acquired), right foot: Secondary | ICD-10-CM

## 2021-04-30 NOTE — Progress Notes (Signed)
Subjective:  ? ?Patient ID: Sally Ware, female   DOB: 61 y.o.   MRN: [id number]  ? ?HPI ?Patient states doing well very pleased so far with results minimal discomfort ? ? ?ROS ? ? ?   ?Objective:  ?Physical Exam  ?Neurovascular status intact negative Bevelyn Buckles' sign noted wound edges healed well good range of motion no crepitus of the joint first MPJ mild edema ? ?   ?Assessment:  ?Doing well post osteotomy distal first metatarsal right ? ?   ?Plan:  ?Continue with compression elevation and immobilization gradual increase in range of motion and dispensed surgical shoe ankle compression stocking.  Reappoint 3 weeks or earlier if necessary does not do any walking on the foot at the current time ? ?X-rays indicate osteotomies healing well fixation in place joint congruence ?   ? ? ?

## 2021-05-18 ENCOUNTER — Ambulatory Visit (INDEPENDENT_AMBULATORY_CARE_PROVIDER_SITE_OTHER): Payer: Medicare Other | Admitting: Neurology

## 2021-05-18 DIAGNOSIS — G43719 Chronic migraine without aura, intractable, without status migrainosus: Secondary | ICD-10-CM | POA: Diagnosis not present

## 2021-05-18 MED ORDER — ONABOTULINUMTOXINA 100 UNITS IJ SOLR
185.0000 [IU] | Freq: Once | INTRAMUSCULAR | Status: AC
  Administered 2021-05-18: 155 [IU] via INTRAMUSCULAR

## 2021-05-18 MED ORDER — UBRELVY 100 MG PO TABS: 1.0000 | ORAL_TABLET | ORAL | 5 refills | Status: DC | PRN

## 2021-05-18 NOTE — Progress Notes (Signed)
[facility]  ? ?Procedure Note Botox ? ?Attending: Dr. Metta Clines ? ?Preoperative Diagnosis(es): Chronic migraine ? ?Consent obtained from: The patient ?Benefits discussed included, but were not limited to decreased muscle tightness, increased joint range of motion, and decreased pain.  Risk discussed included, but were not limited pain and discomfort, bleeding, bruising, excessive weakness, venous thrombosis, muscle atrophy and dysphagia.  Anticipated outcomes of the procedure as well as he risks and benefits of the alternatives to the procedure, and the roles and tasks of the personnel to be involved, were discussed with the patient, and the patient consents to the procedure and agrees to proceed. A copy of the patient medication guide was given to the patient which explains the blackbox warning. ? ?Patients identity and treatment sites confirmed Yes.  . ? ?Details of Procedure: ?Skin was cleaned with alcohol. Prior to injection, the needle plunger was aspirated to make sure the needle was not within a blood vessel.  There was no blood retrieved on aspiration.   ? ?Following is a summary of the muscles injected  And the amount of Botulinum toxin used: ? ?Dilution ?200 units of Botox was reconstituted with 4 ml of preservative free normal saline. ?Time of reconstitution: At the time of the office visit (<30 minutes prior to injection)  ? ?Injections  ?155 total units of Botox was injected with a 30 gauge needle. ? ?Injection Sites: ?L occipitalis: 15 units- 3 sites  ?R occiptalis: 15 units- 3 sites ? ?L upper trapezius: 15 units- 3 sites ?R upper trapezius: 15 units- 3 sits          ?L paraspinal: 10 units- 2 sites ?R paraspinal: 10 units- 2 sites ? ?Face ?L frontalis(2 injection sites):10 units   ?R frontalis(2 injection sites):10 units         ?L corrugator: 5 units   ?R corrugator: 5 units           ?Procerus: 5 units   ?L temporalis: 20 units ?R temporalis: 20 units  ? ?Agent:  ?200 units of botulinum Type  A (Onobotulinum Toxin type A) was reconstituted with 4 ml of preservative free normal saline.  ?Time of reconstitution: At the time of the office visit (<30 minutes prior to injection)  ? ? ? Total injected (Units):  155 ? Total wasted (Units): 30 ? ?Patient tolerated procedure well without complications.   ?Reinjection is anticipated in 3 months. ? ? ?

## 2021-05-21 ENCOUNTER — Encounter: Payer: Medicare Other | Admitting: Podiatry

## 2021-05-25 ENCOUNTER — Encounter: Payer: Self-pay | Admitting: Podiatry

## 2021-05-25 ENCOUNTER — Ambulatory Visit (INDEPENDENT_AMBULATORY_CARE_PROVIDER_SITE_OTHER): Payer: Medicare Other

## 2021-05-25 ENCOUNTER — Ambulatory Visit (INDEPENDENT_AMBULATORY_CARE_PROVIDER_SITE_OTHER): Payer: Medicare Other | Admitting: Podiatry

## 2021-05-25 DIAGNOSIS — M2011 Hallux valgus (acquired), right foot: Secondary | ICD-10-CM

## 2021-05-25 DIAGNOSIS — Z9889 Other specified postprocedural states: Secondary | ICD-10-CM

## 2021-05-25 NOTE — Progress Notes (Signed)
Subjective:  ? ?Patient ID: Sally Ware, female   DOB: 61 y.o.   MRN: [id number]  ? ?HPI ?Patient states doing excellent with surgery and very pleased and wearing soft shoes ? ? ?ROS ? ? ?   ?Objective:  ?Physical Exam  ?Neurovascular status intact negative Bevelyn Buckles' sign noted first MPJ right healing well excellent range of motion wound edges well coapted ? ?   ?Assessment:  ?Doing well post osteotomy right first metatarsal ? ?   ?Plan:  ?Reviewed condition recommended continuation of conservative care consisting of wider type shoes and elevation and I dispensed ankle compression stocking to wear for the next 8 weeks.  Reappoint as needed ? ?X-rays indicate osteotomies healing well fixation in place good correction of deformity ?   ? ? ?

## 2021-05-28 ENCOUNTER — Other Ambulatory Visit: Payer: Self-pay | Admitting: Registered Nurse

## 2021-05-28 DIAGNOSIS — G479 Sleep disorder, unspecified: Secondary | ICD-10-CM

## 2021-05-28 MED ORDER — TEMAZEPAM 30 MG PO CAPS: 30.0000 mg | ORAL_CAPSULE | Freq: Every evening | ORAL | 0 refills | Status: AC | PRN

## 2021-05-28 NOTE — Telephone Encounter (Signed)
Patient is requesting a refill of the following medications: ?Requested Prescriptions  ? ?Pending Prescriptions Disp Refills  ? temazepam (RESTORIL) 30 MG capsule 30 capsule 0  ?  Sig: Take 1 capsule (30 mg total) by mouth at bedtime as needed for sleep.  ? ? ?Date of patient request: 05/28/21 ?Last office visit: 04/17/21 ?Date of last refill: 04/18/21 ?Last refill amount: 30 ? ?

## 2021-07-08 ENCOUNTER — Other Ambulatory Visit: Payer: Self-pay | Admitting: Registered Nurse

## 2021-07-08 DIAGNOSIS — G479 Sleep disorder, unspecified: Secondary | ICD-10-CM

## 2021-08-02 ENCOUNTER — Other Ambulatory Visit: Payer: Self-pay | Admitting: Registered Nurse

## 2021-08-02 DIAGNOSIS — G43709 Chronic migraine without aura, not intractable, without status migrainosus: Secondary | ICD-10-CM

## 2021-08-02 MED ORDER — BUTALBITAL-APAP-CAFFEINE 50-300-40 MG PO CAPS: 1.0000 | ORAL_CAPSULE | Freq: Four times a day (QID) | ORAL | 0 refills | Status: DC | PRN

## 2021-08-03 ENCOUNTER — Encounter: Payer: Self-pay | Admitting: Registered Nurse

## 2021-08-03 DIAGNOSIS — G43709 Chronic migraine without aura, not intractable, without status migrainosus: Secondary | ICD-10-CM

## 2021-08-03 MED ORDER — BUTALBITAL-APAP-CAFFEINE 50-300-40 MG PO CAPS: 1.0000 | ORAL_CAPSULE | Freq: Four times a day (QID) | ORAL | 0 refills | Status: DC | PRN

## 2021-08-03 NOTE — Telephone Encounter (Signed)
Looks like Marcille Blanco attempted to send this but it printed can you please send

## 2021-08-06 ENCOUNTER — Other Ambulatory Visit: Payer: Self-pay | Admitting: Registered Nurse

## 2021-08-06 DIAGNOSIS — G43709 Chronic migraine without aura, not intractable, without status migrainosus: Secondary | ICD-10-CM

## 2021-08-06 MED ORDER — BUTALBITAL-APAP-CAFFEINE 50-300-40 MG PO CAPS: 1.0000 | ORAL_CAPSULE | Freq: Four times a day (QID) | ORAL | 0 refills | Status: DC | PRN

## 2021-08-17 ENCOUNTER — Ambulatory Visit (INDEPENDENT_AMBULATORY_CARE_PROVIDER_SITE_OTHER): Payer: Medicare Other | Admitting: Neurology

## 2021-08-17 DIAGNOSIS — G43719 Chronic migraine without aura, intractable, without status migrainosus: Secondary | ICD-10-CM

## 2021-08-17 MED ORDER — UBRELVY 100 MG PO TABS: 1.0000 | ORAL_TABLET | ORAL | 5 refills | Status: DC | PRN

## 2021-08-17 MED ORDER — UBRELVY 100 MG PO TABS: ORAL_TABLET | ORAL | 0 refills | Status: DC

## 2021-08-17 MED ORDER — ONABOTULINUMTOXINA 100 UNITS IJ SOLR
200.0000 [IU] | Freq: Once | INTRAMUSCULAR | Status: AC
  Administered 2021-08-17: 155 [IU] via INTRAMUSCULAR

## 2021-08-19 NOTE — Progress Notes (Signed)
[facility]  ° °Procedure Note Botox ° °Attending: Dr. Vermelle Cammarata ° °Preoperative Diagnosis(es): Chronic migraine ° °Consent obtained from: The patient °Benefits discussed included, but were not limited to decreased muscle tightness, increased joint range of motion, and decreased pain.  Risk discussed included, but were not limited pain and discomfort, bleeding, bruising, excessive weakness, venous thrombosis, muscle atrophy and dysphagia.  Anticipated outcomes of the procedure as well as he risks and benefits of the alternatives to the procedure, and the roles and tasks of the personnel to be involved, were discussed with the patient, and the patient consents to the procedure and agrees to proceed. A copy of the patient medication guide was given to the patient which explains the blackbox warning. ° °Patients identity and treatment sites confirmed Yes.  . ° °Details of Procedure: °Skin was cleaned with alcohol. Prior to injection, the needle plunger was aspirated to make sure the needle was not within a blood vessel.  There was no blood retrieved on aspiration.   ° °Following is a summary of the muscles injected  And the amount of Botulinum toxin used: ° °Dilution °200 units of Botox was reconstituted with 4 ml of preservative free normal saline. °Time of reconstitution: At the time of the office visit (<30 minutes prior to injection)  ° °Injections  °155 total units of Botox was injected with a 30 gauge needle. ° °Injection Sites: °L occipitalis: 15 units- 3 sites  °R occiptalis: 15 units- 3 sites ° °L upper trapezius: 15 units- 3 sites °R upper trapezius: 15 units- 3 sits          °L paraspinal: 10 units- 2 sites °R paraspinal: 10 units- 2 sites ° °Face °L frontalis(2 injection sites):10 units   °R frontalis(2 injection sites):10 units         °L corrugator: 5 units   °R corrugator: 5 units           °Procerus: 5 units   °L temporalis: 20 units °R temporalis: 20 units  ° °Agent:  °200 units of botulinum Type  A (Onobotulinum Toxin type A) was reconstituted with 4 ml of preservative free normal saline.  °Time of reconstitution: At the time of the office visit (<30 minutes prior to injection)  ° ° ° Total injected (Units):  155 ° Total wasted (Units):  45 ° °Patient tolerated procedure well without complications.   °Reinjection is anticipated in 3 months. ° ° °

## 2021-09-06 ENCOUNTER — Telehealth: Payer: Self-pay | Admitting: Neurology

## 2021-09-06 NOTE — Telephone Encounter (Signed)
Per patient, the Botox did not work at help at all. Same frequency and intensity.   Patient states she like the the [place] but her insurance will not cover it.  Advised patient Dr.Jaffe comes back we can see if he wants to try the PA again with the insurance with [place] knowing now the Botox is ineffective.

## 2021-09-06 NOTE — Telephone Encounter (Signed)
Patient called and stated she wanted to cancel her next Botox appt.  She said that her left eye is shut and shes having neck pain and still migraines.  She wanted to see if there is another route she could take, without doing botox.  I did not cancel the appt yet.

## 2021-09-10 ENCOUNTER — Other Ambulatory Visit (HOSPITAL_COMMUNITY): Payer: Self-pay

## 2021-09-10 NOTE — Telephone Encounter (Signed)
If Botox is ineffective, then we can try one of the monthly self injections (like an epi-pen).  If patient agreeable, please send prescription for Aimovig '140mg'$  every 28 days.     I don't believe we discussed qulipta.  Does she mean Ubrelvy?  I would like to appeal - she has tried multiple triptans (as listed in my initial office note) and the Roselyn Meier apparently is effective.  If her insurance still denies Roselyn Meier and the reason is her insurance prefers Nurtec, we can send a prescription for Nurtec '75mg'$  daily as needed.  Quantity 8, refills 5.   Per patient she is not sure if her insurance will/ or did cover the Urblevy. She was advised of the cost and she couldn't afford the cost through local or mail in pharmacy.   As for the [place] she will try it. I advised patient not sure if it will need a PA or not but we will send it in just in case.    PA team please send a PA in for both the Aimovig and urblevy.

## 2021-09-11 NOTE — Telephone Encounter (Signed)
Tried calling patient, no answer. LMOVM to call the office back.

## 2021-09-12 ENCOUNTER — Other Ambulatory Visit (HOSPITAL_COMMUNITY): Payer: Self-pay

## 2021-09-12 ENCOUNTER — Telehealth: Payer: Self-pay

## 2021-09-12 ENCOUNTER — Encounter: Payer: Self-pay | Admitting: Neurology

## 2021-09-12 MED ORDER — NURTEC 75 MG PO TBDP: 75.0000 mg | ORAL_TABLET | ORAL | 5 refills | Status: DC | PRN

## 2021-09-12 NOTE — Telephone Encounter (Signed)
Scanned into chart

## 2021-09-12 NOTE — Telephone Encounter (Signed)
Pt called in she was informed it looks like her insurance prefers Nurtec, we can send a prescription for Nurtec '75mg'$  daily as needed.  Quantity 8, refills 5. Pt verbalized understanding and stated she will try anything, prescription sent to pt pharmacy

## 2021-09-12 NOTE — Telephone Encounter (Signed)
Sent to PA team Patient also wanted me to tell Heather that [facility] doesn't prescribe this medication at this time

## 2021-09-12 NOTE — Telephone Encounter (Signed)
Patient called and said Nurtec needs a PA due to the cost per [facility].  She said it was supposed to be covered as an option but the cost is too high.

## 2021-09-18 ENCOUNTER — Telehealth: Payer: Self-pay | Admitting: Neurology

## 2021-09-18 NOTE — Telephone Encounter (Signed)
Please see other encounter.

## 2021-09-18 NOTE — Telephone Encounter (Signed)
Patient is following up with chelsea regarding the PA for new medicine since she cant tolerate Botox.

## 2021-09-18 NOTE — Telephone Encounter (Signed)
Telephone call to patient, Advised patient what the PA team found out while trying to do her PA.  Per Patient, She was aware she did not have pharmacy coverage. She was hoping that the [place] would be covered under her Medical Benefits.

## 2021-10-04 ENCOUNTER — Other Ambulatory Visit: Payer: Self-pay | Admitting: Family Medicine

## 2021-10-04 DIAGNOSIS — Z1231 Encounter for screening mammogram for malignant neoplasm of breast: Secondary | ICD-10-CM

## 2021-10-08 NOTE — Progress Notes (Unsigned)
NEUROLOGY FOLLOW UP OFFICE NOTE  Sally Ware [id number]  Assessment/Plan:   Chronic migraine without aura, without status migrainosus, not intractable  Migraine prevention:  *** Migraine rescue:  *** Limit use of pain relievers to no more than 2 days out of week to prevent risk of rebound or medication-overuse headache. Keep headache diary Follow up ***   Subjective:  Sally Ware is a 61 year old female who follows up for migraines.  UPDATE: Started Botox.  She is status post 2 rounds.  She also tried samples of Ubrelvy *** Intensity:  *** Duration:  *** Frequency:  *** Frequency of abortive medication: *** Current NSAIDS/analgesics:  Excedrin, Fioricet Current triptans:  none Current ergotamine:  none Current anti-emetic:  none Current muscle relaxants:  none Current Antihypertensive medications:  indapamide 2.'5mg'$   Current Antidepressant medications:  bupropion ER '150mg'$  daily Current Anticonvulsant medications:  topiramate '50mg'$  BID Current anti-CGRP:  *** Current Vitamins/Herbal/Supplements:  none Current Antihistamines/Decongestants:  Zyrtec Other therapy:  Botox Hormone/birth control:  none Other medications:  temazepam '30mg'$  QHS PRN (insomnia)  Caffeine:  No Alcohol:  no Smoker:  no Diet:  hydrates.  Eats healthy.  No soda.  Tries not to skip meals Exercise:  yes Depression/Anxiety:  Daughter was murdered by her husband several years ago.  She and her husband has since been raising her grandson who witnessed the event.  He has had mental health issues over the years which have been a great source of emotional stress.   Other pain:  no Sleep hygiene:  poor    HISTORY: Onset:  61 years old Location:  top of head Quality:  pressure Intensity:  8-19/10.  She denies new headache, thunderclap headache Aura:  absent Prodrome:  absent Associated symptoms:  Nausea, photophobia, phonophobia.  She denies associated vomiting, visual aura, unilateral numbness or  weakness. Duration:  Sometimes wakes up with headaches.  2 days Frequency:  4 to 5 days a week Frequency of abortive medication: Excedrin or Fioricet Triggers:  lunch meat, chocolate, alcohol Relieving factors:  headache hat, rest in dark and quiet room Activity:  aggravates.      Past NSAIDS/analgesics:  Midrin Past abortive triptans:  sumatriptan tab/[place], rizatriptan, eletriptan, zolmitriptan, Axert Past abortive ergotamine:  none Past muscle relaxants:  none Past anti-emetic:  zofran, promethazine Past antihypertensive medications:  propranolol, verapamil Past antidepressant medications:  amitriptyline, venlafaxine Past anticonvulsant medications:  Depakote, gabapentin Past anti-CGRP:  none Past vitamins/Herbal/Supplements:  magnesium, riboflavin, CoQ10, feverfew, butterbur Past antihistamines/decongestants:  none Other past therapies:  accu-pressure, chiropractic    Family history of headache:  mother, daughters  PAST MEDICAL HISTORY: Past Medical History:  Diagnosis Date   Eye cancer, left [facility])     MEDICATIONS: Current Outpatient Medications on File Prior to Visit  Medication Sig Dispense Refill   buPROPion (WELLBUTRIN XL) 150 MG 24 hr tablet Take 1 tablet (150 mg total) by mouth daily. 90 tablet 1   Butalbital-APAP-Caffeine 50-300-40 MG CAPS Take 1 tablet by mouth every 6 (six) hours as needed. 84 capsule 0   cetirizine (ZYRTEC) 10 MG tablet Take 1 tablet (10 mg total) by mouth daily. 30 tablet 11   indapamide (LOZOL) 2.5 MG tablet Take 1 tablet (2.5 mg total) by mouth daily. 90 tablet 1   LORazepam (ATIVAN) 1 MG tablet Take 1 tablet (1 mg total) by mouth 2 (two) times daily as needed for anxiety. 90 tablet 1   meclizine (ANTIVERT) 25 MG tablet Take 25 mg by mouth 3 (three) times  daily as needed for dizziness.     ondansetron (ZOFRAN) 4 MG tablet Take 1 tablet (4 mg total) by mouth every 8 (eight) hours as needed for nausea or vomiting. 20 tablet 5   Rimegepant Sulfate  (NURTEC) 75 MG TBDP Take 75 mg by mouth as needed. 8 tablet 5   temazepam (RESTORIL) 30 MG capsule Take 1 capsule (30 mg total) by mouth at bedtime as needed for sleep. 90 capsule 0   triamcinolone (KENALOG) 0.1 % paste Use as directed 1 application. in the mouth or throat 2 (two) times daily. 5 g 12   Ubrogepant (UBRELVY) 100 MG TABS Take 1 tablet by mouth as needed. 10 tablet 5   Ubrogepant (UBRELVY) 100 MG TABS Medication Samples have been provided to the patient.  Drug name: Roselyn Meier        Strength: '100mg'$         Qty: 4 boxes  LOT: [id number]  Exp.Date: 09/2023  Dosing instructions:   The patient has been instructed regarding the correct time, dose, and frequency of taking this medication, including desired effects and most common side effects.   Sally Ware 1:40 PM 08/17/2021 4 tablet 0   No current facility-administered medications on file prior to visit.    ALLERGIES: No Known Allergies  FAMILY HISTORY: Family History  Problem Relation Age of Onset   Cancer Mother    Anxiety disorder Sister    OCD Sister    Healthy Daughter    Healthy Son       Objective:  *** General: No acute distress.  Patient appears well-groomed.   Head:  Normocephalic/atraumatic Eyes:  Fundi examined but not visualized Neck: supple, no paraspinal tenderness, full range of motion Heart:  Regular rate and rhythm Lungs:  Clear to auscultation bilaterally Back: No paraspinal tenderness Neurological Exam: alert and oriented to person, place, and time.  Speech fluent and not dysarthric, language intact.  CN II-XII intact. Bulk and tone normal, muscle strength 5/5 throughout.  Sensation to light touch intact.  Deep tendon reflexes 2+ throughout.  Finger to nose testing intact.  Gait normal, Romberg negative.   Metta Clines, DO

## 2021-10-10 ENCOUNTER — Telehealth: Payer: Self-pay | Admitting: Pharmacy Technician

## 2021-10-10 ENCOUNTER — Ambulatory Visit (INDEPENDENT_AMBULATORY_CARE_PROVIDER_SITE_OTHER): Payer: Medicare Other | Admitting: Neurology

## 2021-10-10 ENCOUNTER — Encounter: Payer: Self-pay | Admitting: Neurology

## 2021-10-10 VITALS — BP 117/84 | HR 78 | Resp 18 | Ht 66.0 in | Wt 135.0 lb

## 2021-10-10 DIAGNOSIS — G43719 Chronic migraine without aura, intractable, without status migrainosus: Secondary | ICD-10-CM | POA: Diagnosis not present

## 2021-10-10 MED ORDER — SUMATRIPTAN 20 MG/ACT NA SOLN: 20.0000 mg | NASAL | 5 refills | Status: DC | PRN

## 2021-10-10 NOTE — Progress Notes (Signed)
Medication Samples have been provided to the patient.  Drug name: Hinda Kehr       Strength: 100 mg        Qty: 4  LOT: [id number]  Exp.Date: 06/2022  Dosing instructions: as needed  The patient has been instructed regarding the correct time, dose, and frequency of taking this medication, including desired effects and most common side effects.   Venetia Night 11:06 AM 10/10/2021

## 2021-10-10 NOTE — Telephone Encounter (Signed)
Dr. Tomi Likens, Juluis Rainier note:  Auth Submission: NO AUTH NEEDED Payer: medicare a&b Medication & CPT/J Code(s) submitted: Vyepti ([place]) [id number] Route of submission (phone, fax, portal):  Phone # [phone] Fax # Auth type: Buy/Bill Units/visits requested: '100mg'$  q66moReference number: [id number] from: 10/10/21 to 01/27/22   Patient may be responsible for 20% co-pay.  Patient may be eligible for patient assistance if 20% if not feasible for patient.

## 2021-10-10 NOTE — Patient Instructions (Addendum)
Keep Botox scheduled for now.  Plan to start Vyepti.  Still contact Medicare and ask about Aimovig, Emgality, Ajovy as well.  If none of those options are feasible, I would still give Botox another shot At earliest onset of migraine, use sumatriptan nasal spray as directed.  If ineffective, contact me Follow up for routine office visit in 4 months.

## 2021-10-22 ENCOUNTER — Ambulatory Visit: Payer: Medicare Other | Admitting: Registered Nurse

## 2021-10-29 ENCOUNTER — Ambulatory Visit (INDEPENDENT_AMBULATORY_CARE_PROVIDER_SITE_OTHER): Payer: Medicare Other

## 2021-10-29 VITALS — BP 113/76 | HR 67 | Temp 97.8°F | Resp 18 | Ht 66.0 in | Wt 140.8 lb

## 2021-10-29 DIAGNOSIS — G43719 Chronic migraine without aura, intractable, without status migrainosus: Secondary | ICD-10-CM | POA: Diagnosis not present

## 2021-10-29 MED ORDER — SODIUM CHLORIDE 0.9 % IV SOLN
100.0000 mg | Freq: Once | INTRAVENOUS | Status: AC
  Administered 2021-10-29: 100 mg via INTRAVENOUS
  Filled 2021-10-29: qty 1

## 2021-10-29 NOTE — Progress Notes (Signed)
Diagnosis: Chronic Migraine  Provider:  Marshell Garfinkel MD  Procedure: Infusion  IV Type: Peripheral, IV Location: L Antecubital  Vyepti (Eptinezumab-jjmr), Dose: 100 mg  Infusion Start Time: 3009  Infusion Stop Time: 2330  Post Infusion IV Care: Observation period completed and Peripheral IV Discontinued  Discharge: Condition: Good, Destination: Home . AVS provided to patient.   Performed by:  Arnoldo Morale, RN

## 2021-11-16 ENCOUNTER — Ambulatory Visit: Payer: Medicare Other | Admitting: Neurology

## 2021-11-21 ENCOUNTER — Encounter: Payer: Self-pay | Admitting: Podiatry

## 2021-11-21 ENCOUNTER — Ambulatory Visit (INDEPENDENT_AMBULATORY_CARE_PROVIDER_SITE_OTHER): Payer: Medicare Other | Admitting: Podiatry

## 2021-11-21 ENCOUNTER — Ambulatory Visit (INDEPENDENT_AMBULATORY_CARE_PROVIDER_SITE_OTHER): Payer: Medicare Other

## 2021-11-21 DIAGNOSIS — M7672 Peroneal tendinitis, left leg: Secondary | ICD-10-CM

## 2021-11-21 DIAGNOSIS — M779 Enthesopathy, unspecified: Secondary | ICD-10-CM | POA: Diagnosis not present

## 2021-11-21 MED ORDER — TRIAMCINOLONE ACETONIDE 10 MG/ML IJ SUSP
10.0000 mg | Freq: Once | INTRAMUSCULAR | Status: AC
  Administered 2021-11-21: 10 mg

## 2021-11-22 NOTE — Progress Notes (Signed)
Subjective:   Patient ID: Margrett Rud, female   DOB: 61 y.o.   MRN: [id number]   HPI Patient states doing well with her right bunion and knows she needs her left one fixed but wants to hold off and has a lot of pain in the outside of the left foot   ROS      Objective:  Physical Exam  Neurovascular status intact with good bunion correction right with bunion deformity left she knows she needs to get corrected at 1 point.  On the outside of the left foot there is inflammation of the peroneal tendon at the insertion base of fifth metatarsal with no indication of tendon dysfunction     Assessment:  Peroneal tendinitis left with inflammation structural bunion deformity left foot     Plan:  Read you both conditions at this point on focusing on the peroneal and I did discuss injection and risk and she wants injection I did sterile prep injected the sheath near insertion 3 mg dexamethasone Kenalog 5 mg Xylocaine and patient will be seen back as symptoms indicate  X-rays do not indicate signs of bone structural issue appears to be a soft tissue pathology

## 2022-01-18 ENCOUNTER — Ambulatory Visit
Admission: RE | Admit: 2022-01-18 | Discharge: 2022-01-18 | Disposition: A | Discharge status: Home / Self Care | Payer: Medicare Other | Source: Ambulatory Visit | Attending: Family Medicine | Admitting: Family Medicine

## 2022-01-18 DIAGNOSIS — Z1231 Encounter for screening mammogram for malignant neoplasm of breast: Secondary | ICD-10-CM

## 2022-01-29 ENCOUNTER — Ambulatory Visit: Payer: Medicare Other

## 2022-03-04 NOTE — Progress Notes (Unsigned)
NEUROLOGY FOLLOW UP OFFICE NOTE  Sally Ware [id number]  Assessment/Plan:   Chronic migraine without aura, without status migrainosus, intractable   Migraine prevention:  Plan to start Aimovig '140mg'$  Migraine rescue:  Will have her try samples of Nurtec.     Limit use of pain relievers to no more than 2 days out of week to prevent risk of rebound or medication-overuse headache. Keep headache diary Follow up 4-5 months.     Subjective:  Sally Ware is a 62 year old right-handed female who follows up for migraines.   UPDATE: Switched from Botox to Vyepti.  She did not like it.  Migraines became worse.   She had adverse reaction to sumatriptan NS. Intensity:  severe Duration:  1 hour with Sally Ware, otherwise all day Frequency:  14 days a month Current NSAIDS/analgesics:  none Current triptans:  none Current ergotamine:  none Current anti-emetic:  none Current muscle relaxants:  none Current Antihypertensive medications:  indapamide 2.'5mg'$   Current Antidepressant medications:  bupropion ER '150mg'$  daily Current Anticonvulsant medications:  none Current anti-CGRP:  none Current Vitamins/Herbal/Supplements:  none Current Antihistamines/Decongestants:  Zyrtec Other therapy:  none Hormone/birth control:  none Other medications:  temazepam '30mg'$  QHS PRN (insomnia)   Caffeine:  No Alcohol:  no Smoker:  no Diet:  hydrates.  Eats healthy.  No soda.  Tries not to skip meals Exercise:  yes Depression/Anxiety:  Daughter was murdered by her husband several years ago.  She and her husband has since been raising her grandson who witnessed the event.  He has had mental health issues over the years which have been a great source of emotional stress.   Other pain:  no Sleep hygiene:  poor     HISTORY: Onset:  62 years old Location:  top of head Quality:  pressure Intensity:  8-19/10.  She denies new headache, thunderclap headache Aura:  absent Prodrome:  absent Associated symptoms:   Nausea, photophobia, phonophobia.  She denies associated vomiting, visual aura, unilateral numbness or weakness. Duration:  Sometimes wakes up with headaches.  2 days Frequency:  4 to 5 days a week Frequency of abortive medication: Excedrin or Fioricet Triggers:  lunch meat, chocolate, alcohol Relieving factors:  headache hat, rest in dark and quiet room Activity:  aggravates.       Past NSAIDS/analgesics:  Midrin, Excedrin, Fioricet Past abortive triptans:  sumatriptan tab/[place]/NS, rizatriptan, eletriptan, zolmitriptan, Axert Past abortive ergotamine:  none Past muscle relaxants:  none Past anti-emetic:  zofran, promethazine Past antihypertensive medications:  propranolol, verapamil Past antidepressant medications:  amitriptyline, venlafaxine Past anticonvulsant medications:  topiramate, Depakote, gabapentin Past anti-CGRP:  Vyepti, Ubrelvy (effective but not covered) Past vitamins/Herbal/Supplements:  magnesium, riboflavin, CoQ10, feverfew, butterbur Past antihistamines/decongestants:  none Other past therapies:  Botox, accu-pressure, chiropractic     Family history of headache:  mother, daughters  PAST MEDICAL HISTORY: Past Medical History:  Diagnosis Date   Eye cancer, left [facility])     MEDICATIONS: Current Outpatient Medications on File Prior to Visit  Medication Sig Dispense Refill   buPROPion (WELLBUTRIN XL) 150 MG 24 hr tablet Take 1 tablet (150 mg total) by mouth daily. 90 tablet 1   cetirizine (ZYRTEC) 10 MG tablet Take 1 tablet (10 mg total) by mouth daily. 30 tablet 11   indapamide (LOZOL) 2.5 MG tablet Take 1 tablet (2.5 mg total) by mouth daily. 90 tablet 1   LORazepam (ATIVAN) 1 MG tablet Take 1 tablet (1 mg total) by mouth 2 (two) times daily as needed  for anxiety. 90 tablet 1   ondansetron (ZOFRAN) 4 MG tablet Take 1 tablet (4 mg total) by mouth every 8 (eight) hours as needed for nausea or vomiting. 20 tablet 5   temazepam (RESTORIL) 30 MG capsule Take 1 capsule  (30 mg total) by mouth at bedtime as needed for sleep. 90 capsule 0   triamcinolone (KENALOG) 0.1 % paste Use as directed 1 application. in the mouth or throat 2 (two) times daily. 5 g 12   No current facility-administered medications on file prior to visit.     ALLERGIES: Allergies  Allergen Reactions   Yellow Jacket Venom Swelling    FAMILY HISTORY: Family History  Problem Relation Age of Onset   Cancer Mother    Anxiety disorder Sister    OCD Sister    Healthy Daughter    Healthy Son       Objective:  Blood pressure 113/77, pulse 66, height '5\' 7"'$  (1.702 m), weight 132 lb (59.9 kg), SpO2 98 %. General: No acute distress.  Patient appears well-groomed.   Head:  Normocephalic/atraumatic Eyes:  Fundi examined but not visualized Neck: supple, no paraspinal tenderness, full range of motion Heart:  Regular rate and rhythm Neurological Exam: alert and oriented to person, place, and time.  Speech fluent and not dysarthric, language intact.  CN II-XII intact. Bulk and tone normal, muscle strength 5/5 throughout.  Sensation to light touch intact.  Deep tendon reflexes 2+ throughout,.  Finger to nose testing intact.  Gait normal, Romberg negative.   Sally Clines, DO  CC: Sally Pace, FNP

## 2022-03-05 ENCOUNTER — Encounter: Payer: Self-pay | Admitting: Neurology

## 2022-03-05 ENCOUNTER — Ambulatory Visit (INDEPENDENT_AMBULATORY_CARE_PROVIDER_SITE_OTHER): Payer: Medicare Other | Admitting: Neurology

## 2022-03-05 VITALS — BP 113/77 | HR 66 | Ht 67.0 in | Wt 132.0 lb

## 2022-03-05 DIAGNOSIS — G43109 Migraine with aura, not intractable, without status migrainosus: Secondary | ICD-10-CM | POA: Diagnosis not present

## 2022-03-05 MED ORDER — AIMOVIG 140 MG/ML ~~LOC~~ SOAJ: 140.0000 mg | SUBCUTANEOUS | 11 refills | Status: AC

## 2022-03-05 NOTE — Progress Notes (Signed)
Medication Samples have been provided to the patient.  Drug name: Nurtec       Strength: 75 mg        Qty: 2   LOT: [id number]  Exp.Date: 3/26  Dosing instructions: as needed  The patient has been instructed regarding the correct time, dose, and frequency of taking this medication, including desired effects and most common side effects.   Venetia Night 2:40 PM 03/05/2022

## 2022-03-05 NOTE — Patient Instructions (Signed)
Will try to start Aimovig '140mg'$  injection every 28 days When you get a migraine, take Nurtec (once daily as needed). Let me know how it works for you Follow up 4-5 months.

## 2022-03-06 ENCOUNTER — Encounter: Payer: Self-pay | Admitting: Neurology

## 2022-03-07 ENCOUNTER — Other Ambulatory Visit: Payer: Self-pay

## 2022-03-07 DIAGNOSIS — I10 Essential (primary) hypertension: Secondary | ICD-10-CM

## 2022-03-07 DIAGNOSIS — F4321 Adjustment disorder with depressed mood: Secondary | ICD-10-CM

## 2022-03-07 DIAGNOSIS — G479 Sleep disorder, unspecified: Secondary | ICD-10-CM

## 2022-03-07 DIAGNOSIS — G43709 Chronic migraine without aura, not intractable, without status migrainosus: Secondary | ICD-10-CM

## 2022-03-07 MED ORDER — ONDANSETRON HCL 4 MG PO TABS: 4.0000 mg | ORAL_TABLET | Freq: Three times a day (TID) | ORAL | 5 refills | Status: AC | PRN

## 2022-03-08 ENCOUNTER — Telehealth: Payer: Self-pay

## 2022-03-08 MED ORDER — ZONISAMIDE 25 MG PO CAPS: ORAL_CAPSULE | ORAL | 0 refills | Status: AC

## 2022-03-08 NOTE — Telephone Encounter (Signed)
Per DR.Jaffe, Please send prescription for zonisamide as directed in  2m - take 1 capsule daily for one week, then 2 capsules daily for one week, then 3 capsules daily for one week, then 4 capsules daily (quantity 120, refills 0).

## 2022-08-05 NOTE — Progress Notes (Deleted)
NEUROLOGY FOLLOW UP OFFICE NOTE  Sally Ware [id number]  Assessment/Plan:   Chronic migraine without aura, without status migrainosus, intractable   Migraine prevention:  Plan to start Aimovig 140mg  Migraine rescue:  Will have Sally try samples of Nurtec.     Limit use of pain relievers to no more than 2 days out of week to prevent risk of rebound or medication-overuse headache. Keep headache diary Follow up 4-5 months.     Subjective:  Sally Ware is a 62 year old right-handed female who follows up for migraines.   UPDATE: Aimovig was too expensive.  Started zonisamide.  Nurtec ineffective.   Intensity:  severe Duration:  1 hour with Sally Ware, otherwise all day *** Frequency:  14 days a month *** Current NSAIDS/analgesics:  none Current triptans:  none Current ergotamine:  none Current anti-emetic:  none Current muscle relaxants:  none Current Antihypertensive medications:  indapamide 2.5mg   Current Antidepressant medications:  bupropion ER 150mg  daily Current Anticonvulsant medications:  Zonisamide 100mg  daily Current anti-CGRP:  Ubrelvy 100mg  *** Current Vitamins/Herbal/Supplements:  none Current Antihistamines/Decongestants:  Zyrtec Other therapy:  none Hormone/birth control:  none Other medications:  temazepam 30mg  QHS PRN (insomnia)   Caffeine:  No Alcohol:  no Smoker:  no Diet:  hydrates.  Eats healthy.  No soda.  Tries not to skip meals Exercise:  yes Depression/Anxiety:  Sally Ware was murdered by Sally husband several years ago.  She and Sally husband has since been raising Sally Ware who witnessed the event.  He has had mental health issues over the years which have been a great source of emotional stress.   Other pain:  no Sleep hygiene:  poor     HISTORY: Onset:  62 years old Location:  top of head Quality:  pressure Intensity:  8-19/10.  She denies new headache, thunderclap headache Aura:  absent Prodrome:  absent Associated symptoms:  Nausea,  photophobia, phonophobia.  She denies associated vomiting, visual aura, unilateral numbness or weakness. Duration:  Sometimes wakes up with headaches.  2 days Frequency:  4 to 5 days a week Frequency of abortive medication: Excedrin or Fioricet Triggers:  lunch meat, chocolate, alcohol Relieving factors:  headache hat, rest in dark and quiet room Activity:  aggravates.       Past NSAIDS/analgesics:  Midrin, Excedrin, Fioricet Past abortive triptans:  sumatriptan tab/[place]/NS, rizatriptan, eletriptan, zolmitriptan, Axert Past abortive ergotamine:  none Past muscle relaxants:  none Past anti-emetic:  zofran, promethazine Past antihypertensive medications:  propranolol, verapamil Past antidepressant medications:  amitriptyline, venlafaxine Past anticonvulsant medications:  topiramate, Depakote, gabapentin Past anti-CGRP:  Vyepti, Nurtec Past vitamins/Herbal/Supplements:  magnesium, riboflavin, CoQ10, feverfew, butterbur Past antihistamines/decongestants:  none Other past therapies:  Botox, accu-pressure, chiropractic     Family history of headache:  mother, daughters  PAST MEDICAL HISTORY: Past Medical History:  Diagnosis Date   Eye cancer, left [facility])     MEDICATIONS: Current Outpatient Medications on File Prior to Visit  Medication Sig Dispense Refill   buPROPion (WELLBUTRIN XL) 150 MG 24 hr tablet Take 1 tablet (150 mg total) by mouth daily. 90 tablet 1   cetirizine (ZYRTEC) 10 MG tablet Take 1 tablet (10 mg total) by mouth daily. 30 tablet 11   indapamide (LOZOL) 2.5 MG tablet Take 1 tablet (2.5 mg total) by mouth daily. 90 tablet 1   LORazepam (ATIVAN) 1 MG tablet Take 1 tablet (1 mg total) by mouth 2 (two) times daily as needed for anxiety. 90 tablet 1   ondansetron (ZOFRAN) 4  MG tablet Take 1 tablet (4 mg total) by mouth every 8 (eight) hours as needed for nausea or vomiting. 20 tablet 5   temazepam (RESTORIL) 30 MG capsule Take 1 capsule (30 mg total) by mouth at bedtime as  needed for sleep. 90 capsule 0   triamcinolone (KENALOG) 0.1 % paste Use as directed 1 application. in the mouth or throat 2 (two) times daily. 5 g 12   No current facility-administered medications on file prior to visit.     ALLERGIES: Allergies  Allergen Reactions   Yellow Jacket Venom Swelling    FAMILY HISTORY: Family History  Problem Relation Age of Onset   Cancer Mother    Anxiety disorder Sister    OCD Sister    Healthy Sally Ware    Healthy Son       Objective:  *** General: No acute distress.  Patient appears well-groomed.   Head:  Normocephalic/atraumatic Eyes:  Fundi examined but not visualized Neck: supple, no paraspinal tenderness, full range of motion Heart:  Regular rate and rhythm Neurological Exam: ***   Sally Millet, DO  CC: Sally Bushy, FNP

## 2022-08-06 ENCOUNTER — Ambulatory Visit: Payer: Medicare Other | Admitting: Neurology

## 2022-09-26 NOTE — Progress Notes (Deleted)
NEUROLOGY FOLLOW UP OFFICE NOTE  Sally Ware [id number]  Assessment/Plan:   Chronic migraine without aura, without status migrainosus, intractable   Migraine prevention:  Zonisamide 100mg  daily *** Migraine rescue:  Ubrelvy 100mg  *** Limit use of pain relievers to no more than 2 days out of week to prevent risk of rebound or medication-overuse headache. Keep headache diary Follow up 6-7 months ***     Subjective:  Sally Ware is a 62 year old right-handed female who follows up for migraines.   UPDATE: Aimovig was expensive.  Started zonisamide.  ***   Intensity:  severe Duration:  1 hour with Sally Ware, otherwise all day Frequency:  14 days a month Current NSAIDS/analgesics:  none Current triptans:  none Current ergotamine:  none Current anti-emetic:  none Current muscle relaxants:  none Current Antihypertensive medications:  indapamide 2.5mg   Current Antidepressant medications:  bupropion ER 150mg  daily Current Anticonvulsant medications:  zonisamide 100mg  daily Current anti-CGRP:  none *** Current Vitamins/Herbal/Supplements:  none Current Antihistamines/Decongestants:  Zyrtec Other therapy:  none Hormone/birth control:  none Other medications:  temazepam 30mg  QHS PRN (insomnia)   Caffeine:  No Alcohol:  no Smoker:  no Diet:  hydrates.  Eats healthy.  No soda.  Tries not to skip meals Exercise:  yes Depression/Anxiety:  Daughter was murdered by her husband several years ago.  She and her husband has since been raising her grandson who witnessed the event.  He has had mental health issues over the years which have been a great source of emotional stress.   Other pain:  no Sleep hygiene:  poor     HISTORY: Onset:  62 years old Location:  top of head Quality:  pressure Intensity:  8-19/10.  She denies new headache, thunderclap headache Aura:  absent Prodrome:  absent Associated symptoms:  Nausea, photophobia, phonophobia.  She denies associated vomiting,  visual aura, unilateral numbness or weakness. Duration:  Sometimes wakes up with headaches.  2 days Frequency:  4 to 5 days a week Frequency of abortive medication: Excedrin or Fioricet Triggers:  lunch meat, chocolate, alcohol Relieving factors:  headache hat, rest in dark and quiet room Activity:  aggravates.       Past NSAIDS/analgesics:  Midrin, Excedrin, Fioricet Past abortive triptans:  sumatriptan tab/[place]/NS, rizatriptan, eletriptan, zolmitriptan, Axert Past abortive ergotamine:  none Past muscle relaxants:  none Past anti-emetic:  zofran, promethazine Past antihypertensive medications:  propranolol, verapamil Past antidepressant medications:  amitriptyline, venlafaxine Past anticonvulsant medications:  topiramate, Depakote, gabapentin Past anti-CGRP:  Vyepti, Ubrelvy (effective but not covered), Nurtec PRN Past vitamins/Herbal/Supplements:  magnesium, riboflavin, CoQ10, feverfew, butterbur Past antihistamines/decongestants:  none Other past therapies:  Botox, accu-pressure, chiropractic     Family history of headache:  mother, daughters  PAST MEDICAL HISTORY: Past Medical History:  Diagnosis Date   Eye cancer, left [facility])     MEDICATIONS: Current Outpatient Medications on File Prior to Visit  Medication Sig Dispense Refill   buPROPion (WELLBUTRIN XL) 150 MG 24 hr tablet Take 1 tablet (150 mg total) by mouth daily. 90 tablet 1   cetirizine (ZYRTEC) 10 MG tablet Take 1 tablet (10 mg total) by mouth daily. 30 tablet 11   Erenumab-aooe (AIMOVIG) 140 MG/ML SOAJ Inject 140 mg into the skin every 28 (twenty-eight) days. 1.12 mL 11   indapamide (LOZOL) 2.5 MG tablet Take 1 tablet (2.5 mg total) by mouth daily. 90 tablet 1   LORazepam (ATIVAN) 1 MG tablet Take 1 tablet (1 mg total) by mouth 2 (two) times  daily as needed for anxiety. 90 tablet 1   ondansetron (ZOFRAN) 4 MG tablet Take 1 tablet (4 mg total) by mouth every 8 (eight) hours as needed for nausea or vomiting. 20 tablet  5   temazepam (RESTORIL) 30 MG capsule Take 1 capsule (30 mg total) by mouth at bedtime as needed for sleep. 90 capsule 0   triamcinolone (KENALOG) 0.1 % paste Use as directed 1 application. in the mouth or throat 2 (two) times daily. 5 g 12   zonisamide (ZONEGRAN) 25 MG capsule 25mg  - take 1 capsule daily for one week, then 2 capsules daily for one week, then 3 capsules daily for one week, then 4 capsules daily. 120 capsule 0   No current facility-administered medications on file prior to visit.     ALLERGIES: Allergies  Allergen Reactions   Yellow Jacket Venom Swelling    FAMILY HISTORY: Family History  Problem Relation Age of Onset   Cancer Mother    Anxiety disorder Sister    OCD Sister    Healthy Daughter    Healthy Son       Objective:  *** General: No acute distress.  Patient appears well-groomed.   Head:  Normocephalic/atraumatic Eyes:  Fundi examined but not visualized Neck: supple, no paraspinal tenderness, full range of motion Heart:  Regular rate and rhythm Neurological Exam: ***   Shon Millet, DO  CC: Sally Bushy, FNP

## 2022-10-01 ENCOUNTER — Ambulatory Visit: Payer: Medicare Other | Admitting: Neurology

## 2022-12-11 ENCOUNTER — Other Ambulatory Visit: Payer: Self-pay

## 2023-10-10 ENCOUNTER — Other Ambulatory Visit: Payer: Self-pay

## 2023-10-16 IMAGING — MG MM DIGITAL SCREENING BILAT W/ TOMO AND CAD
8 series · 8 of 24 positions shown · non-contrast
Comparison: Previous exam(s).

CLINICAL DATA: Screening.

EXAM:
DIGITAL SCREENING BILATERAL MAMMOGRAM WITH TOMOSYNTHESIS AND CAD
TECHNIQUE: Bilateral screening digital craniocaudal and mediolateral oblique
mammograms were obtained. Bilateral screening digital breast
tomosynthesis was performed. The images were evaluated with
computer-aided detection.

[L CC synth-2D]
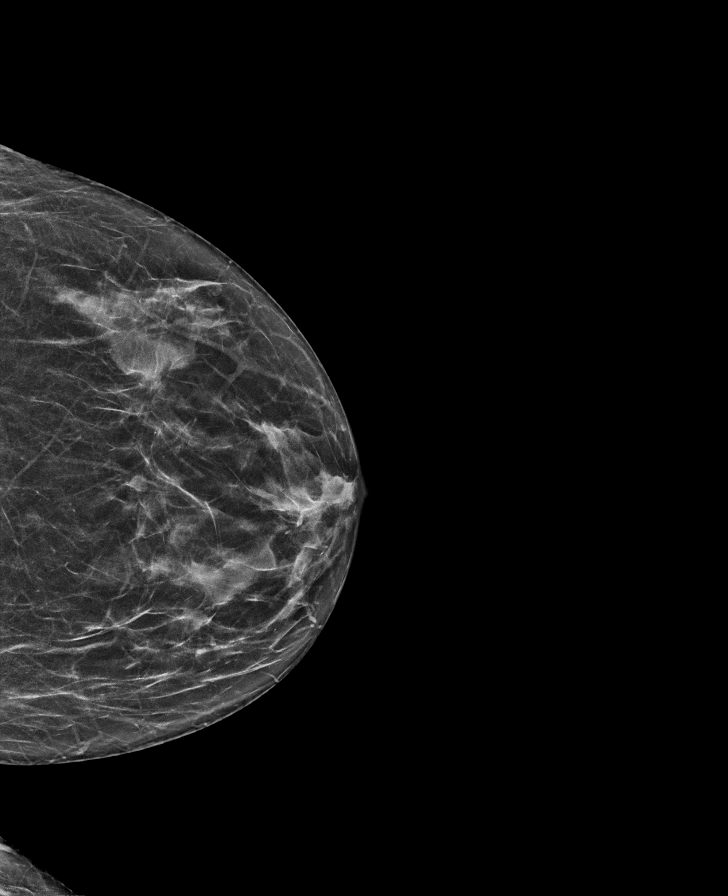

[L MLO synth-2D]
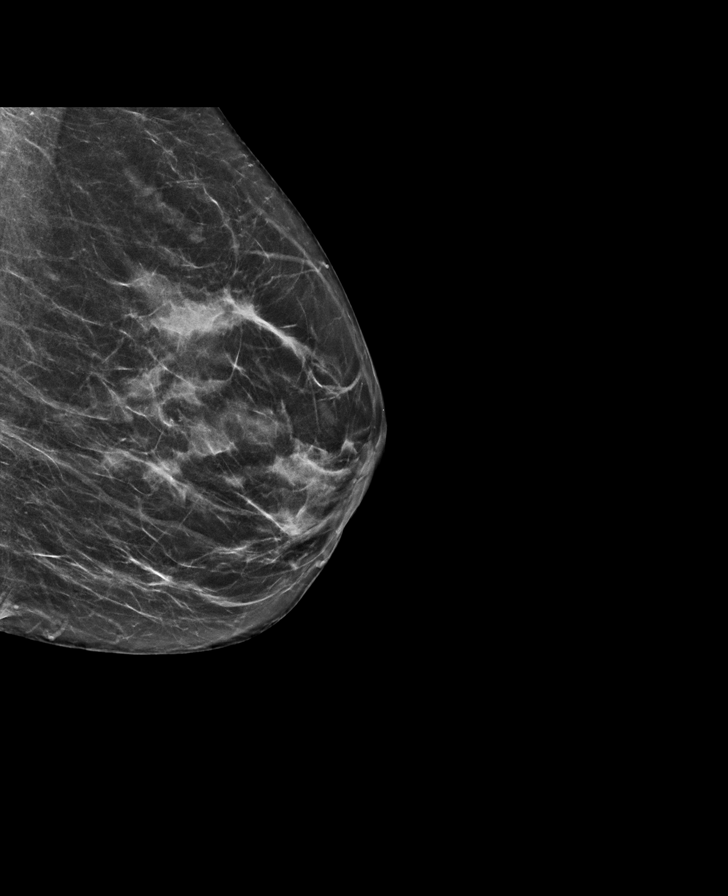

[R MLO synth-2D]
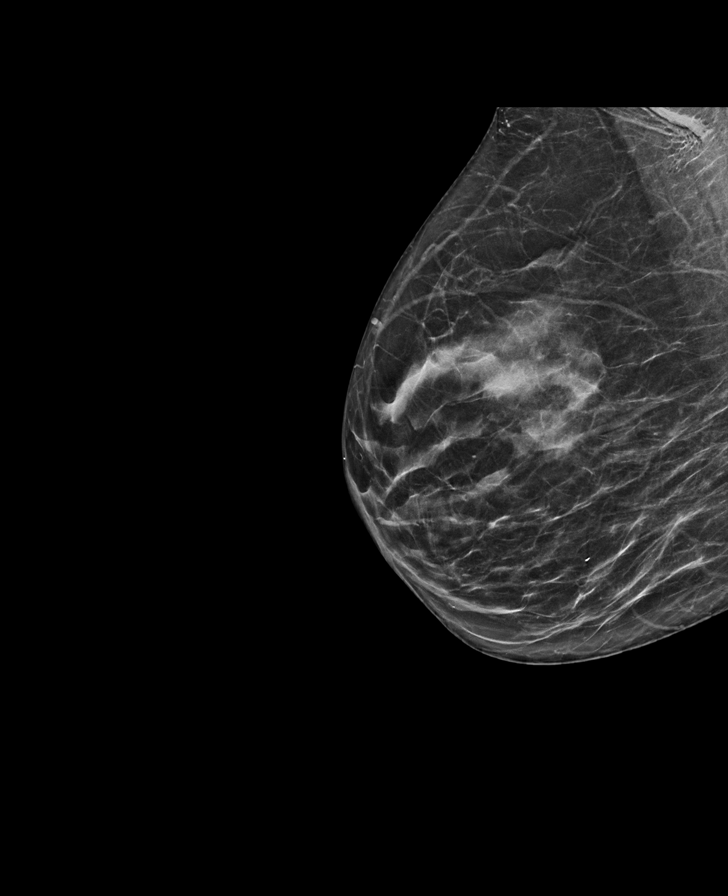

[R CC synth-2D]
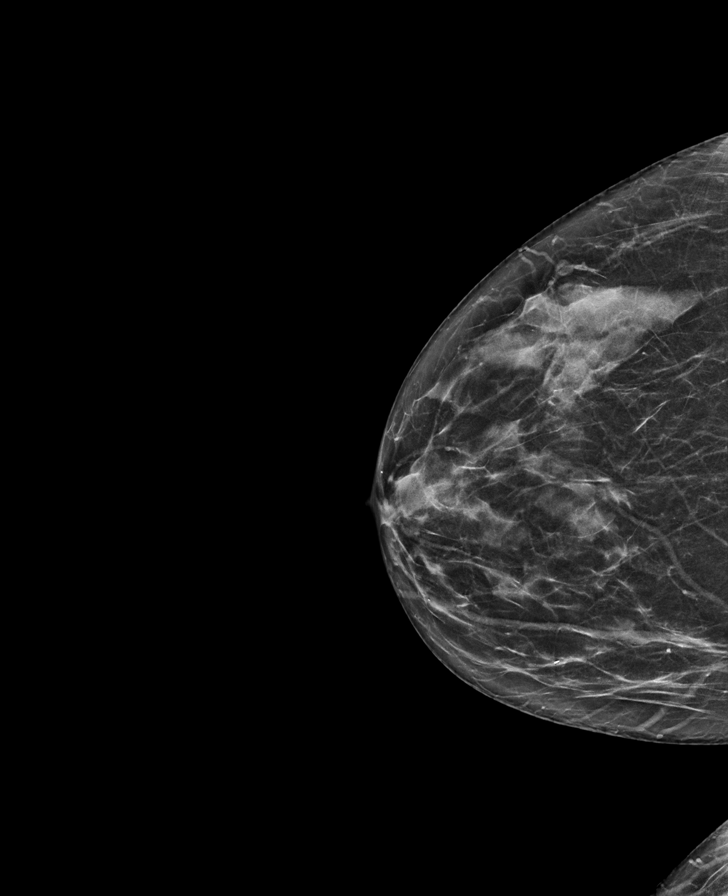

[R MLO tomo · tomo slice 31/62.0]
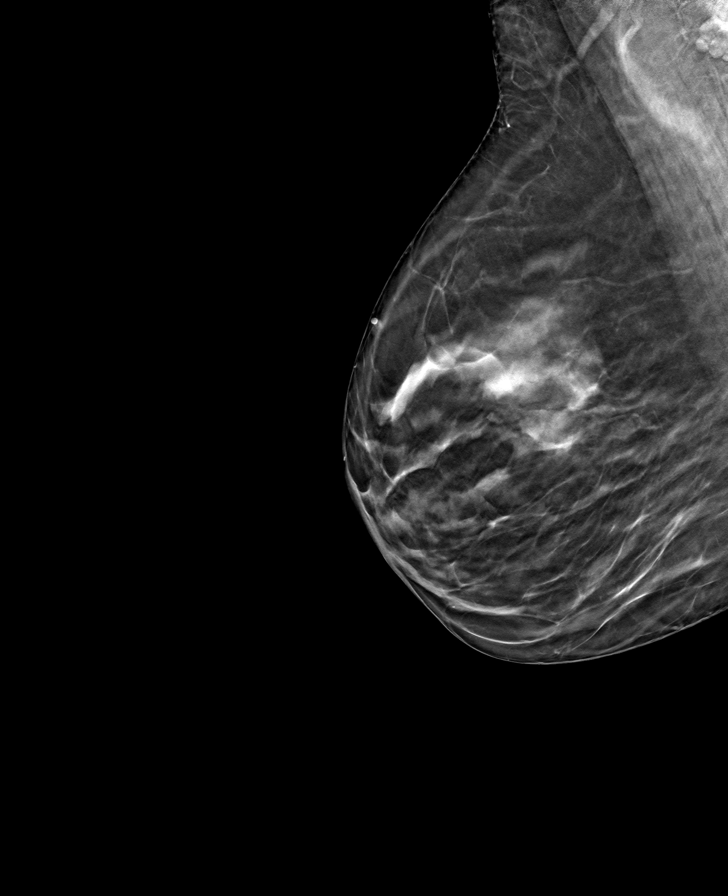

[L MLO tomo · tomo slice 29/57.0]
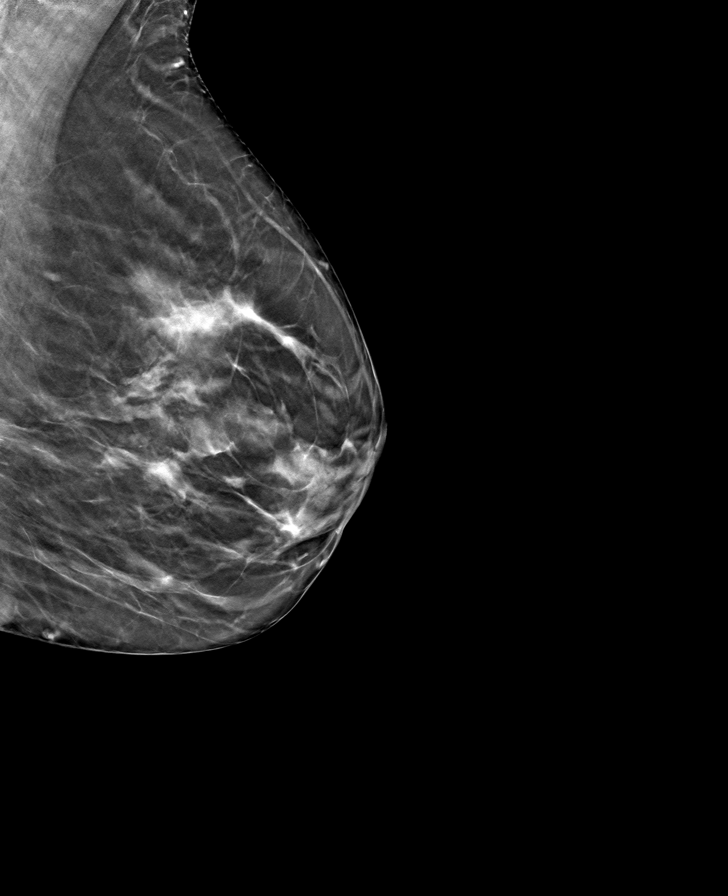

[R CC tomo · tomo slice 29/58.0]
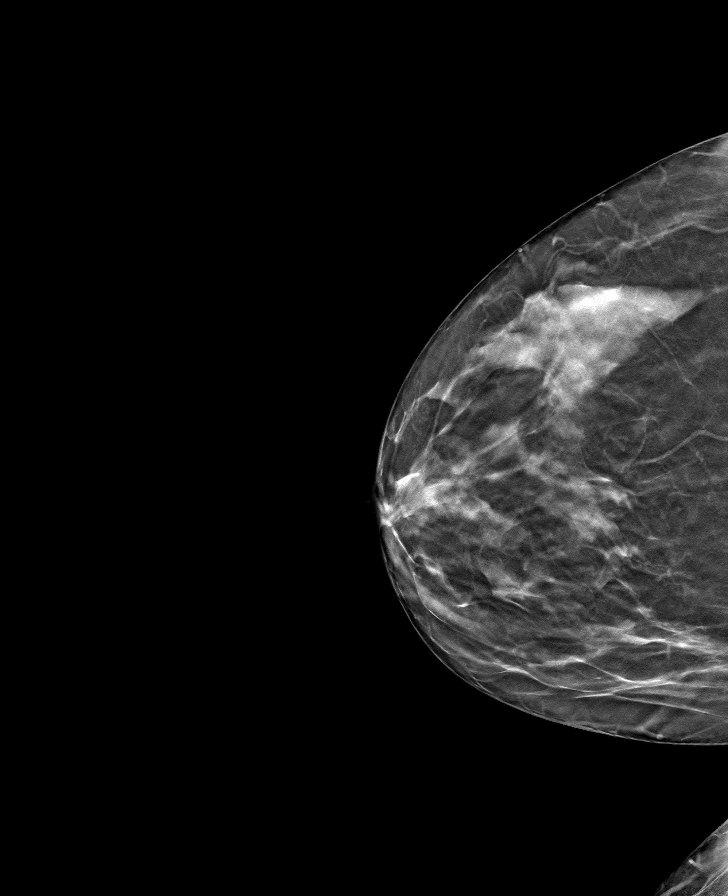

[L CC tomo · tomo slice 29/58.0]
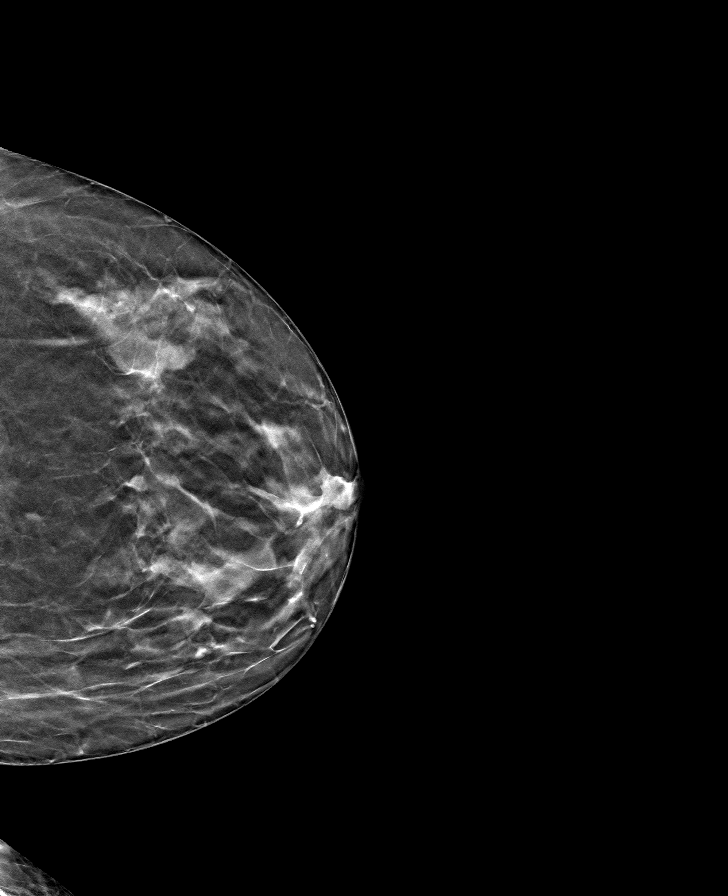

[8 of 24 positions shown; findings below may reference images not displayed]

ACR Breast Density Category c: The breast tissue is heterogeneously
dense, which may obscure small masses.
FINDINGS: There are no findings suspicious for malignancy.
IMPRESSION: No mammographic evidence of malignancy. A result letter of this
screening mammogram will be mailed directly to the patient.

RECOMMENDATION:
Screening mammogram in one year. (Code:Q3-W-BC3)

BI-RADS CATEGORY  1: Negative.

## 2023-11-10 ENCOUNTER — Other Ambulatory Visit: Payer: Self-pay | Admitting: Family Medicine

## 2023-11-10 DIAGNOSIS — Z1231 Encounter for screening mammogram for malignant neoplasm of breast: Secondary | ICD-10-CM

## 2023-11-26 ENCOUNTER — Ambulatory Visit
Admission: RE | Admit: 2023-11-26 | Discharge: 2023-11-26 | Disposition: A | Discharge status: Home / Self Care | Source: Ambulatory Visit | Attending: Family Medicine | Admitting: Family Medicine

## 2023-11-26 DIAGNOSIS — Z1231 Encounter for screening mammogram for malignant neoplasm of breast: Secondary | ICD-10-CM
